# Patient Record
Sex: Female | Born: 1990 | Hispanic: No | Marital: Married | Smoking: Never smoker
Health system: Southern US, Community
[De-identification: ages and names within clinical notes are randomized; demographics above are authoritative.]

## PROBLEM LIST (undated history)

## (undated) ENCOUNTER — Inpatient Hospital Stay (HOSPITAL_COMMUNITY): Payer: Self-pay

## (undated) DIAGNOSIS — Z789 Other specified health status: Secondary | ICD-10-CM

## (undated) DIAGNOSIS — R51 Headache: Secondary | ICD-10-CM

## (undated) DIAGNOSIS — Z8669 Personal history of other diseases of the nervous system and sense organs: Secondary | ICD-10-CM

## (undated) DIAGNOSIS — O26613 Liver and biliary tract disorders in pregnancy, third trimester: Principal | ICD-10-CM

## (undated) DIAGNOSIS — K831 Obstruction of bile duct: Secondary | ICD-10-CM

## (undated) HISTORY — DX: Obstruction of bile duct: K83.1

## (undated) HISTORY — DX: Liver and biliary tract disorders in pregnancy, third trimester: O26.613

---
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
## 2010-06-17 NOTE — L&D Delivery Note (Signed)
Delivery Note At 3:59 PM a viable female was delivered via Vaginal, Spontaneous Delivery (Presentation: ; Occiput Anterior).  APGAR: 9, 9; weight 6 lb 6.5 oz (2906 g).   Placenta status: Intact, Spontaneous.  Cord: 3 vessels with the following complications: None.   Anesthesia: Epidural  Episiotomy: None Lacerations: 1st degree;Sulcus Suture Repair: 2.0 3.0 vicryl rapide Est. Blood Loss (mL): 200  Mom to postpartum.  Baby to nursery-stable.  Reed,Dana Dollinger A 06/02/2011, 4:26 PM

## 2010-10-01 LAB — URINALYSIS, ROUTINE W REFLEX MICROSCOPIC
Bilirubin Urine: NEGATIVE
Nitrite: NEGATIVE
Protein, ur: NEGATIVE mg/dL
Urobilinogen, UA: 0.2 mg/dL (ref 0.0–1.0)

## 2010-10-01 LAB — POCT PREGNANCY, URINE: Preg Test, Ur: NEGATIVE

## 2010-10-01 LAB — URINE MICROSCOPIC-ADD ON

## 2010-12-11 LAB — SYPHILIS: RPR W/REFLEX TO RPR TITER AND TREPONEMAL ANTIBODIES, TRADITIONAL SCREENING AND DIAGNOSIS ALGORITHM: RPR: NONREACTIVE

## 2010-12-11 LAB — GC/CHLAMYDIA PROBE AMP, GENITAL
Chlamydia: NEGATIVE
Gonorrhea: NEGATIVE

## 2010-12-11 LAB — HEPATITIS B SURFACE ANTIGEN: Hepatitis B Surface Ag: NEGATIVE

## 2010-12-11 LAB — ABO/RH: "RH Type ": POSITIVE

## 2010-12-11 LAB — ANTIBODY SCREEN: Antibody Screen: NEGATIVE

## 2010-12-22 ENCOUNTER — Inpatient Hospital Stay (HOSPITAL_COMMUNITY)
Admission: AD | Admit: 2010-12-22 | Discharge: 2010-12-23 | Disposition: A | Discharge status: Home / Self Care | Payer: Self-pay | Source: Ambulatory Visit | Attending: Obstetrics & Gynecology | Admitting: Obstetrics & Gynecology

## 2010-12-22 ENCOUNTER — Inpatient Hospital Stay (HOSPITAL_COMMUNITY): Payer: Self-pay | Admitting: Obstetrics & Gynecology

## 2010-12-22 ENCOUNTER — Inpatient Hospital Stay (HOSPITAL_COMMUNITY): Admission: AD | Admit: 2010-12-22 | Payer: Self-pay | Source: Ambulatory Visit | Admitting: Obstetrics & Gynecology

## 2010-12-22 DIAGNOSIS — N949 Unspecified condition associated with female genital organs and menstrual cycle: Secondary | ICD-10-CM

## 2010-12-22 DIAGNOSIS — M549 Dorsalgia, unspecified: Secondary | ICD-10-CM

## 2010-12-22 DIAGNOSIS — R109 Unspecified abdominal pain: Secondary | ICD-10-CM

## 2010-12-22 DIAGNOSIS — O99891 Other specified diseases and conditions complicating pregnancy: Secondary | ICD-10-CM | POA: Insufficient documentation

## 2010-12-22 DIAGNOSIS — O9989 Other specified diseases and conditions complicating pregnancy, childbirth and the puerperium: Secondary | ICD-10-CM

## 2010-12-22 HISTORY — DX: Personal history of other diseases of the nervous system and sense organs: Z86.69

## 2010-12-22 NOTE — Progress Notes (Signed)
Pt states, " I was awakened at 1 am this morning with tightening and cramping in my low abd and low back and it lasted for 2 hrs. It started back at 10 am, and it has hurt all day. It subsides and comes back."

## 2010-12-23 ENCOUNTER — Encounter (HOSPITAL_COMMUNITY): Payer: Self-pay | Admitting: Obstetrics and Gynecology

## 2010-12-23 LAB — URINALYSIS, ROUTINE W REFLEX MICROSCOPIC
Glucose, UA: NEGATIVE mg/dL
Leukocytes, UA: NEGATIVE
Protein, ur: NEGATIVE mg/dL
pH: 6 (ref 5.0–8.0)

## 2010-12-23 LAB — URINE MICROSCOPIC-ADD ON

## 2010-12-23 NOTE — Progress Notes (Signed)
"  Strong cramps in abd since last night.  The pain is constant and felt it all day.  The pain is felt around the belly button and in lower abd.  I feel something moving in my abd.  No bleeding or leaking of fluid from vagina."

## 2010-12-23 NOTE — ED Provider Notes (Signed)
History   Pt presents today c/o abd pain that comes and goes. She has also noticed some fetal movement. She denies vag dc or bleeding.  Chief Complaint  Patient presents with  . Abdominal Pain  . Back Pain   The history is provided by the patient. The history is limited by a language barrier. A language interpreter was used.    OB History    Grav Para Term Preterm Abortions TAB SAB Ect Mult Living   1         0      Past Medical History  Diagnosis Date  . Hx of migraines     duration = 1 year    Past Surgical History  Procedure Date  . Laparoscopic ovarian cystectomy 2007    No family history on file.  History  Substance Use Topics  . Smoking status: Never Smoker   . Smokeless tobacco: Not on file  . Alcohol Use: No     "I've only had a drink once ever."    Allergies: Allergies no known allergies  Prescriptions prior to admission  Medication Sig Dispense Refill  . diphenhydrAMINE (SOMINEX) 25 MG tablet Take 25 mg by mouth at bedtime as needed. For itching       . prenatal vitamin w/FE, FA (PRENATAL 1 + 1) 27-1 MG TABS Take 1 tablet by mouth daily.          Review of Systems  Constitutional: Negative for fever.  Cardiovascular: Negative for chest pain.  Genitourinary: Negative for dysuria, urgency and frequency.  Neurological: Negative for headaches.  Psychiatric/Behavioral: Negative for depression and suicidal ideas.   Physical Exam   Blood pressure 98/58, pulse 76, temperature 98.6 F (37 C), temperature source Oral, height 5\' 2"  (1.575 m), weight 134 lb 2 oz (60.839 kg), last menstrual period 08/12/2010.  Physical Exam  Constitutional: She appears well-developed and well-nourished.  GI: She exhibits no distension. There is no tenderness. There is no rebound and no guarding.  Genitourinary: No tenderness or bleeding around the vagina. No vaginal discharge found.       Cervix Lg/closed.    MAU Course  Procedures  A/P: 1) Round ligament pain:  discussed with pt at length with interpreter. Discussed diet, activity, risks, and precautions. She will f/u with Dr. Verdell Carmine office.    Henrietta Hoover, [place] 12/23/10 0126

## 2011-05-13 LAB — STREP B DNA PROBE: GBS: NEGATIVE

## 2011-06-02 ENCOUNTER — Inpatient Hospital Stay (HOSPITAL_COMMUNITY): Payer: Medicaid Other | Admitting: Anesthesiology

## 2011-06-02 ENCOUNTER — Encounter (HOSPITAL_COMMUNITY): Payer: Self-pay | Admitting: Anesthesiology

## 2011-06-02 ENCOUNTER — Inpatient Hospital Stay (HOSPITAL_COMMUNITY)
Admission: AD | Admit: 2011-06-02 | Discharge: 2011-06-04 | DRG: 775 | Disposition: A | Discharge status: Home / Self Care | Payer: Medicaid Other | Source: Ambulatory Visit | Attending: Obstetrics & Gynecology | Admitting: Obstetrics & Gynecology

## 2011-06-02 ENCOUNTER — Encounter (HOSPITAL_COMMUNITY): Payer: Self-pay | Admitting: *Deleted

## 2011-06-02 DIAGNOSIS — O4700 False labor before 37 completed weeks of gestation, unspecified trimester: Secondary | ICD-10-CM | POA: Diagnosis present

## 2011-06-02 DIAGNOSIS — IMO0002 Reserved for concepts with insufficient information to code with codable children: Secondary | ICD-10-CM | POA: Diagnosis present

## 2011-06-02 HISTORY — DX: Other specified health status: Z78.9

## 2011-06-02 LAB — CBC
Platelets: 226 10*3/uL (ref 150–400)
RBC: 4.32 MIL/uL (ref 3.87–5.11)
WBC: 9.4 10*3/uL (ref 4.0–10.5)

## 2011-06-02 LAB — RPR: RPR Ser Ql: NONREACTIVE

## 2011-06-02 LAB — ABO/RH: ABO/RH(D): O POS

## 2011-06-02 MED ORDER — OXYTOCIN 20 UNITS IN LACTATED RINGERS INFUSION - SIMPLE
125.0000 mL/h | Freq: Once | INTRAVENOUS | Status: AC
  Administered 2011-06-02: 125 mL/h via INTRAVENOUS
  Filled 2011-06-02: qty 1000

## 2011-06-02 MED ORDER — INFLUENZA VIRUS VACC SPLIT PF IM SUSP
0.5000 mL | INTRAMUSCULAR | Status: DC
  Filled 2011-06-02: qty 0.5

## 2011-06-02 MED ORDER — EPHEDRINE 5 MG/ML INJ: 10.0000 mg | INTRAVENOUS | Status: DC | PRN

## 2011-06-02 MED ORDER — MEDROXYPROGESTERONE ACETATE 150 MG/ML IM SUSP: 150.0000 mg | INTRAMUSCULAR | Status: DC | PRN

## 2011-06-02 MED ORDER — BENZOCAINE-MENTHOL 20-0.5 % EX AERO
INHALATION_SPRAY | CUTANEOUS | Status: AC
  Administered 2011-06-02
  Filled 2011-06-02: qty 56

## 2011-06-02 MED ORDER — FENTANYL 2.5 MCG/ML BUPIVACAINE 1/10 % EPIDURAL INFUSION (WH - ANES)
14.0000 mL/h | INTRAMUSCULAR | Status: DC
  Administered 2011-06-02 (×2): 14 mL/h via EPIDURAL
  Filled 2011-06-02 (×2): qty 60

## 2011-06-02 MED ORDER — FERROUS SULFATE 325 (65 FE) MG PO TABS
325.0000 mg | ORAL_TABLET | Freq: Two times a day (BID) | ORAL | Status: DC
  Administered 2011-06-03 – 2011-06-04 (×3): 325 mg via ORAL
  Filled 2011-06-02 (×4): qty 1

## 2011-06-02 MED ORDER — DIPHENHYDRAMINE HCL 50 MG/ML IJ SOLN: 12.5000 mg | INTRAMUSCULAR | Status: DC | PRN

## 2011-06-02 MED ORDER — PRENATAL PLUS 27-1 MG PO TABS
1.0000 | ORAL_TABLET | Freq: Every day | ORAL | Status: DC
  Administered 2011-06-03 – 2011-06-04 (×2): 1 via ORAL
  Filled 2011-06-02 (×2): qty 1

## 2011-06-02 MED ORDER — LACTATED RINGERS IV SOLN: 500.0000 mL | Freq: Once | INTRAVENOUS | Status: DC

## 2011-06-02 MED ORDER — LIDOCAINE HCL (PF) 1 % IJ SOLN
30.0000 mL | INTRAMUSCULAR | Status: DC | PRN
  Administered 2011-06-02: 30 mL via SUBCUTANEOUS
  Filled 2011-06-02: qty 30

## 2011-06-02 MED ORDER — TETANUS-DIPHTH-ACELL PERTUSSIS 5-2.5-18.5 LF-MCG/0.5 IM SUSP: 0.5000 mL | Freq: Once | INTRAMUSCULAR | Status: DC

## 2011-06-02 MED ORDER — IBUPROFEN 600 MG PO TABS
600.0000 mg | ORAL_TABLET | Freq: Four times a day (QID) | ORAL | Status: DC
  Administered 2011-06-02 – 2011-06-04 (×8): 600 mg via ORAL
  Filled 2011-06-02 (×9): qty 1

## 2011-06-02 MED ORDER — ACETAMINOPHEN 325 MG PO TABS: 650.0000 mg | ORAL_TABLET | ORAL | Status: DC | PRN

## 2011-06-02 MED ORDER — DIBUCAINE 1 % RE OINT: 1.0000 "application " | TOPICAL_OINTMENT | RECTAL | Status: DC | PRN

## 2011-06-02 MED ORDER — MEASLES, MUMPS & RUBELLA VAC ~~LOC~~ INJ: 0.5000 mL | INJECTION | Freq: Once | SUBCUTANEOUS | Status: DC

## 2011-06-02 MED ORDER — LANOLIN HYDROUS EX OINT: TOPICAL_OINTMENT | CUTANEOUS | Status: DC | PRN

## 2011-06-02 MED ORDER — ONDANSETRON HCL 4 MG/2ML IJ SOLN: 4.0000 mg | INTRAMUSCULAR | Status: DC | PRN

## 2011-06-02 MED ORDER — OXYTOCIN BOLUS FROM INFUSION
500.0000 mL | Freq: Once | INTRAVENOUS | Status: DC
  Filled 2011-06-02: qty 500

## 2011-06-02 MED ORDER — DIPHENHYDRAMINE HCL 25 MG PO CAPS: 25.0000 mg | ORAL_CAPSULE | Freq: Four times a day (QID) | ORAL | Status: DC | PRN

## 2011-06-02 MED ORDER — CITRIC ACID-SODIUM CITRATE 334-500 MG/5ML PO SOLN: 30.0000 mL | ORAL | Status: DC | PRN

## 2011-06-02 MED ORDER — MISOPROSTOL 50MCG HALF TABLET
75.0000 ug | ORAL_TABLET | ORAL | Status: DC
  Administered 2011-06-02: 75 ug via ORAL
  Filled 2011-06-02 (×2): qty 1
  Filled 2011-06-02: qty 0.75

## 2011-06-02 MED ORDER — FLEET ENEMA 7-19 GM/118ML RE ENEM: 1.0000 | ENEMA | RECTAL | Status: DC | PRN

## 2011-06-02 MED ORDER — MAGNESIUM HYDROXIDE 400 MG/5ML PO SUSP: 30.0000 mL | ORAL | Status: DC | PRN

## 2011-06-02 MED ORDER — PHENYLEPHRINE 40 MCG/ML (10ML) SYRINGE FOR IV PUSH (FOR BLOOD PRESSURE SUPPORT)
80.0000 ug | PREFILLED_SYRINGE | INTRAVENOUS | Status: DC | PRN
  Filled 2011-06-02 (×2): qty 5

## 2011-06-02 MED ORDER — BUTORPHANOL TARTRATE 2 MG/ML IJ SOLN
1.0000 mg | INTRAMUSCULAR | Status: DC | PRN
  Administered 2011-06-02: 1 mg via INTRAVENOUS
  Filled 2011-06-02: qty 1

## 2011-06-02 MED ORDER — ONDANSETRON HCL 4 MG/2ML IJ SOLN: 4.0000 mg | Freq: Four times a day (QID) | INTRAMUSCULAR | Status: DC | PRN

## 2011-06-02 MED ORDER — PHENYLEPHRINE 40 MCG/ML (10ML) SYRINGE FOR IV PUSH (FOR BLOOD PRESSURE SUPPORT)
80.0000 ug | PREFILLED_SYRINGE | INTRAVENOUS | Status: DC | PRN
  Filled 2011-06-02: qty 5

## 2011-06-02 MED ORDER — SENNOSIDES-DOCUSATE SODIUM 8.6-50 MG PO TABS
2.0000 | ORAL_TABLET | Freq: Every day | ORAL | Status: DC
  Administered 2011-06-02 – 2011-06-03 (×2): 2 via ORAL

## 2011-06-02 MED ORDER — TERBUTALINE SULFATE 1 MG/ML IJ SOLN: 0.2500 mg | Freq: Once | INTRAMUSCULAR | Status: DC | PRN

## 2011-06-02 MED ORDER — OXYCODONE-ACETAMINOPHEN 5-325 MG PO TABS: 2.0000 | ORAL_TABLET | ORAL | Status: DC | PRN

## 2011-06-02 MED ORDER — EPHEDRINE 5 MG/ML INJ
10.0000 mg | INTRAVENOUS | Status: DC | PRN
  Filled 2011-06-02: qty 4

## 2011-06-02 MED ORDER — EPHEDRINE 5 MG/ML INJ
10.0000 mg | INTRAVENOUS | Status: DC | PRN
  Filled 2011-06-02 (×2): qty 4

## 2011-06-02 MED ORDER — OXYCODONE-ACETAMINOPHEN 5-325 MG PO TABS
1.0000 | ORAL_TABLET | ORAL | Status: DC | PRN
  Administered 2011-06-03: 1 via ORAL
  Administered 2011-06-03: 2 via ORAL
  Administered 2011-06-04: 1 via ORAL
  Filled 2011-06-02: qty 2
  Filled 2011-06-02 (×2): qty 1

## 2011-06-02 MED ORDER — PHENYLEPHRINE 40 MCG/ML (10ML) SYRINGE FOR IV PUSH (FOR BLOOD PRESSURE SUPPORT): 80.0000 ug | PREFILLED_SYRINGE | INTRAVENOUS | Status: DC | PRN

## 2011-06-02 MED ORDER — INFLUENZA VIRUS VACC SPLIT PF IM SUSP
0.5000 mL | INTRAMUSCULAR | Status: AC
  Filled 2011-06-02: qty 0.5

## 2011-06-02 MED ORDER — WITCH HAZEL-GLYCERIN EX PADS: 1.0000 "application " | MEDICATED_PAD | CUTANEOUS | Status: DC | PRN

## 2011-06-02 MED ORDER — IBUPROFEN 600 MG PO TABS: 600.0000 mg | ORAL_TABLET | Freq: Four times a day (QID) | ORAL | Status: DC | PRN

## 2011-06-02 MED ORDER — FENTANYL 2.5 MCG/ML BUPIVACAINE 1/10 % EPIDURAL INFUSION (WH - ANES): 14.0000 mL/h | INTRAMUSCULAR | Status: DC

## 2011-06-02 MED ORDER — INFLUENZA VIRUS VACC SPLIT PF IM SUSP
0.5000 mL | INTRAMUSCULAR | Status: AC | PRN
  Administered 2011-06-04: 0.5 mL via INTRAMUSCULAR

## 2011-06-02 MED ORDER — BENZOCAINE-MENTHOL 20-0.5 % EX AERO
1.0000 "application " | INHALATION_SPRAY | CUTANEOUS | Status: DC | PRN
  Administered 2011-06-02: 1 via TOPICAL

## 2011-06-02 MED ORDER — ZOLPIDEM TARTRATE 10 MG PO TABS: 10.0000 mg | ORAL_TABLET | Freq: Every evening | ORAL | Status: DC | PRN

## 2011-06-02 MED ORDER — LACTATED RINGERS IV SOLN
INTRAVENOUS | Status: DC
  Administered 2011-06-02 (×2): via INTRAVENOUS

## 2011-06-02 MED ORDER — LACTATED RINGERS IV SOLN
500.0000 mL | INTRAVENOUS | Status: DC | PRN
  Administered 2011-06-02: 500 mL via INTRAVENOUS

## 2011-06-02 MED ORDER — LIDOCAINE HCL 1.5 % IJ SOLN
INTRAMUSCULAR | Status: DC | PRN
  Administered 2011-06-02 (×2): 5 mL via EPIDURAL

## 2011-06-02 MED ORDER — ZOLPIDEM TARTRATE 5 MG PO TABS: 5.0000 mg | ORAL_TABLET | Freq: Every evening | ORAL | Status: DC | PRN

## 2011-06-02 MED ORDER — ONDANSETRON HCL 4 MG PO TABS: 4.0000 mg | ORAL_TABLET | ORAL | Status: DC | PRN

## 2011-06-02 NOTE — Progress Notes (Signed)
  G1 at 38.3wks. Ctxs since 0120 this am. Denies leaking fld or bleeding. Good FM

## 2011-06-02 NOTE — Progress Notes (Signed)
Dr Tamela Oddi notified of patient, tracing, ctx pattern, sve result. Also aware that prenatal record have dates that will make patient 42weeks today. Dr Tamela Oddi states to change the edc to 06/13/11 which is what the patient states that she was given a couple months ago. Order received to admit to l/d unit

## 2011-06-02 NOTE — H&P (Signed)
Dana Reed is a 20 y.o. female presenting for contractions. Maternal Medical History:  Reason for admission: Reason for admission: contractions.  Reason for Admission:   nauseaContractions: Frequency: regular.    Fetal activity: Perceived fetal activity is normal.    Prenatal complications: no prenatal complications   OB History    Grav Para Term Preterm Abortions TAB SAB Ect Mult Living   1         0     Past Medical History  Diagnosis Date  . Hx of migraines     duration = 1 year  . No pertinent past medical history    Past Surgical History  Procedure Date  . Laparoscopic ovarian cystectomy 2007   Family History: family history is not on file. Social History:  reports that she has never smoked. She does not have any smokeless tobacco history on file. She reports that she does not drink alcohol or use illicit drugs.  Review of Systems  Constitutional: Negative for fever.  Eyes: Negative for blurred vision.  Respiratory: Negative for shortness of breath.   Gastrointestinal: Negative for nausea and vomiting.  Skin: Negative for rash.  Neurological: Negative for headaches.    Dilation: 3.5 Effacement (%): 80 Station: -1 Exam by:: Jackson-Moore (IUPC placed) Blood pressure 98/79, pulse 87, temperature 97.8 F (36.6 C), temperature source Axillary, resp. rate 18, height 5\' 3"  (1.6 m), weight 74.753 kg (164 lb 12.8 oz), last menstrual period 08/12/2010, SpO2 98.00%. Maternal Exam:  Uterine Assessment: Contraction strength is moderate.  Contraction frequency is regular.   Abdomen: Patient reports no abdominal tenderness. Fetal presentation: vertex  Introitus: not evaluated.     Fetal Exam Fetal Monitor Review: Variability: moderate (6-25 bpm).   Pattern: accelerations present and no decelerations.    Fetal State Assessment: Category I - tracings are normal.     Physical Exam  Constitutional: She appears well-developed.  HENT:  Head: Normocephalic.   Neck: Neck supple. No thyromegaly present.  Cardiovascular: Normal rate and regular rhythm.   Respiratory: Breath sounds normal.  GI: Soft. Bowel sounds are normal.  Skin: No rash noted.    Prenatal labs: ABO, Rh: O/Positive/-- (06/26 0000) Antibody: Negative (06/26 0000) Rubella: Immune (06/26 0000) RPR: Nonreactive (06/26 0000)  HBsAg: Negative (06/26 0000)  HIV: Non-reactive (06/26 0000)  GBS: Negative (11/26 0000)   Assessment/Plan: [place] at term, active labor, Category 1 FHT. Admit, anticipate an NSVD   JACKSON-MOORE,Jenifer Struve A 06/02/2011, 10:36 AM

## 2011-06-02 NOTE — Progress Notes (Signed)
In-house spanish interpreter helping 

## 2011-06-02 NOTE — Anesthesia Postprocedure Evaluation (Signed)
  Anesthesia Post-op Note  Patient: Dana Reed  Procedure(s) Performed: * No procedures listed *  Patient Location: PACU and Mother/Baby  Anesthesia Type: Epidural  Level of Consciousness: awake, alert  and oriented  Airway and Oxygen Therapy: Patient Spontanous Breathing   Post-op Assessment: Patient's Cardiovascular Status Stable and Respiratory Function Stable  Post-op Vital Signs: stable  Complications: No apparent anesthesia complications

## 2011-06-02 NOTE — Anesthesia Procedure Notes (Signed)
Epidural Patient location during procedure: OB Start time: 06/02/2011 10:28 AM  Staffing Anesthesiologist: Brayton Caves R Performed by: anesthesiologist   Preanesthetic Checklist Completed: patient identified, site marked, surgical consent, pre-op evaluation, timeout performed, IV checked, risks and benefits discussed and monitors and equipment checked  Epidural Patient position: sitting Prep: site prepped and draped and DuraPrep Patient monitoring: continuous pulse ox and blood pressure Approach: midline Injection technique: LOR air and LOR saline  Needle:  Needle type: Tuohy  Needle gauge: 17 G Needle length: 9 cm Needle insertion depth: 6 cm Catheter type: closed end flexible Catheter size: 19 Gauge Catheter at skin depth: 11 cm Test dose: negative  Assessment Events: blood not aspirated, injection not painful, no injection resistance, negative IV test and no paresthesia  Additional Notes Patient identified.  Risk benefits discussed including failed block, incomplete pain control, headache, nerve damage, paralysis, blood pressure changes, nausea, vomiting, reactions to medication both toxic or allergic, and postpartum back pain.  Patient expressed understanding and wished to proceed.  All questions were answered.  Sterile technique used throughout procedure and epidural site dressed with sterile barrier dressing. No paresthesia or other complications noted.The patient did not experience any signs of intravascular injection such as tinnitus or metallic taste in mouth nor signs of intrathecal spread such as rapid motor block. Please see nursing notes for vital signs.

## 2011-06-02 NOTE — Anesthesia Preprocedure Evaluation (Signed)

## 2011-06-03 LAB — CBC
HCT: 29.4 % — ABNORMAL LOW (ref 36.0–46.0)
Hemoglobin: 9.5 g/dL — ABNORMAL LOW (ref 12.0–15.0)
MCHC: 32.3 g/dL (ref 30.0–36.0)
RBC: 3.44 MIL/uL — ABNORMAL LOW (ref 3.87–5.11)
WBC: 12.9 10*3/uL — ABNORMAL HIGH (ref 4.0–10.5)

## 2011-06-03 NOTE — Progress Notes (Signed)
UR chart review completed.  

## 2011-06-03 NOTE — Progress Notes (Signed)
Patient complain of feeling dizzy and tired. Also that her feet are swollen at this time. Currently the patient vitals are the following 106/67 with a heart rate of 76. At this time patient  Return to bed and feet was elevate at this time. Patient refused motrin and Ferrous sulfate at this time. Rn will monitor for now.

## 2011-06-03 NOTE — Progress Notes (Signed)
Post Partum Day 1 Subjective: no complaints, up ad lib, tolerating PO and + flatus  Objective: Blood pressure 99/54, pulse 85, temperature 98.5 F (36.9 C), temperature source Oral, resp. rate 18, height 5\' 3"  (1.6 m), weight 74.753 kg (164 lb 12.8 oz), last menstrual period 08/12/2010, SpO2 100.00%, unknown if currently breastfeeding.  Physical Exam:  General: alert and no distress Lochia: appropriate Uterine Fundus: firm Incision: healing well DVT Evaluation: No evidence of DVT seen on physical exam.   Basename 06/03/11 0515 06/02/11 0515  HGB 9.5* 12.1  HCT 29.4* 36.8    Assessment/Plan: Plan for discharge tomorrow   LOS: 1 day   HARPER,CHARLES A 06/03/2011, 5:50 AM

## 2011-06-04 MED ORDER — FERROUS SULFATE 325 (65 FE) MG PO TABS: 325.0000 mg | ORAL_TABLET | Freq: Two times a day (BID) | ORAL | Status: DC

## 2011-06-04 MED ORDER — IBUPROFEN 600 MG PO TABS: 600.0000 mg | ORAL_TABLET | Freq: Four times a day (QID) | ORAL | Status: AC

## 2011-06-04 NOTE — Discharge Summary (Signed)
Obstetric Discharge Summary Reason for Admission: onset of labor Prenatal Procedures: none Intrapartum Procedures: spontaneous vaginal delivery Postpartum Procedures: none Complications-Operative and Postpartum: 1st degree perineal laceration Hemoglobin  Date Value Range Status  06/03/2011 9.5* 12.0-15.0 (g/dL) Final     DELTA CHECK NOTED     REPEATED TO VERIFY     HCT  Date Value Range Status  06/03/2011 29.4* 36.0-46.0 (%) Final    Discharge Diagnoses: Term Pregnancy-delivered  Discharge Information: Date: 06/04/2011 Activity: unrestricted and pelvic rest Diet: routine Medications: PNV, Ibuprofen and Iron Condition: stable Instructions: see written instructions, call office for appt and any questions. Discharge to: home Follow-up Information    Make an appointment with Roseanna Rainbow, MD.   Contact information:   [address], [address] [place] [address] [phone]          Newborn Data: Live born female  Birth Weight: 6 lb 6.5 oz (2906 g) APGAR: 9, 9  Home with mother.  [name], Dana Reed 06/04/2011, 8:41 AM

## 2011-06-04 NOTE — Progress Notes (Signed)
Post Partum Day 2 Subjective: up ad lib, voiding, tolerating PO, + flatus and desires DC home today. Sore nipples, breastfeeding.   Objective: Blood pressure 102/66, pulse 78, temperature 98.1 F (36.7 C), temperature source Oral, resp. rate 18, height 5\' 3"  (1.6 m), weight 74.753 kg (164 lb 12.8 oz), last menstrual period 08/12/2010, SpO2 100.00%, unknown if currently breastfeeding.  Physical Exam:  General: alert, cooperative, appears stated age and no distress Lochia: appropriate Uterine Fundus: firm Incision: healing well, no significant drainage, no significant erythema, (1st degree laceration) DVT Evaluation: No evidence of DVT seen on physical exam. Negative Homan's sign. No cords or calf tenderness.   Basename 06/03/11 0515 06/02/11 0515  HGB 9.5* 12.1  HCT 29.4* 36.8    Assessment/Plan: Discharge home, Breastfeeding, Lactation consult and Contraception undecided.    LOS: 2 days   [name], REBECCA CNM 06/04/2011, 8:33 AM

## 2011-07-14 ENCOUNTER — Encounter (HOSPITAL_COMMUNITY): Payer: Self-pay | Admitting: Emergency Medicine

## 2011-07-14 ENCOUNTER — Emergency Department (HOSPITAL_COMMUNITY)
Admission: EM | Admit: 2011-07-14 | Discharge: 2011-07-14 | Disposition: A | Discharge status: Home / Self Care | Payer: Self-pay | Attending: Emergency Medicine | Admitting: Emergency Medicine

## 2011-07-14 DIAGNOSIS — O9123 Nonpurulent mastitis associated with lactation: Secondary | ICD-10-CM

## 2011-07-14 DIAGNOSIS — R509 Fever, unspecified: Secondary | ICD-10-CM | POA: Insufficient documentation

## 2011-07-14 DIAGNOSIS — N644 Mastodynia: Secondary | ICD-10-CM | POA: Insufficient documentation

## 2011-07-14 DIAGNOSIS — O9122 Nonpurulent mastitis associated with the puerperium: Secondary | ICD-10-CM | POA: Insufficient documentation

## 2011-07-14 DIAGNOSIS — N63 Unspecified lump in unspecified breast: Secondary | ICD-10-CM | POA: Insufficient documentation

## 2011-07-14 MED ORDER — CEPHALEXIN 500 MG PO CAPS: 500.0000 mg | ORAL_CAPSULE | Freq: Four times a day (QID) | ORAL | Status: DC

## 2011-07-14 NOTE — ED Notes (Signed)
Recent delivery of child on 06/02/2011; pt. Is lactating; over the last 24 hrs. Pt. Having hot and cold spells. Bilateral breast pain when pt. Palpates her breast. Pt. Is mildly lactating bilaterally.

## 2011-07-14 NOTE — ED Provider Notes (Signed)
Medical screening examination/treatment/procedure(s) were conducted as a shared visit with non-physician practitioner(s) and myself.  I personally evaluated the patient during the encounter  Delivered child 1 months ago.  Presents with bilateral breast pain for the past day - described as burning.  Subjective fever and chills.  Mild swelling and tenderness and has no significant redness  Dc home with keflex and instructions to breast feed and or pump   Dayton Bailiff, MD 07/14/11 1712

## 2011-07-14 NOTE — ED Notes (Signed)
C/o pain to bilateral breast, fever, and chills since last night.  Pt is breastfeeding.  Pt had normal vaginal delivery 06/02/11.

## 2011-07-14 NOTE — ED Provider Notes (Signed)
History     CSN: [id number]  Arrival date & time 07/14/11  1536   First MD Initiated Contact with Patient 07/14/11 1607      Chief Complaint  Patient presents with  . Breast Pain    (Consider location/radiation/quality/duration/timing/severity/associated sxs/prior treatment) The history is provided by the patient. A language interpreter was used.  21 year old lactating Hispanic female presents to the ED with bilateral breast pain and redness. She delivered her first child about 1 month ago and has been breastfeeding without any difficulty up until yesterday. She first noticed the breast pain last night around 1800. She has not breastfed since then. She describes the breast pain as pressure, pulsating, and burning. She also complains of chills and fever. She tried Ibuprofen for temporary relief. She denies itching.    Past Medical History  Diagnosis Date  . Hx of migraines     duration = 1 year  . No pertinent past medical history     Past Surgical History  Procedure Date  . Laparoscopic ovarian cystectomy 2007    No family history on file.  History  Substance Use Topics  . Smoking status: Never Smoker   . Smokeless tobacco: Not on file  . Alcohol Use: No     "I've only had a drink once ever."    OB History    Grav Para Term Preterm Abortions TAB SAB Ect Mult Living   1 1 1       1       Review of Systems All pertinent positives and negatives reviewed in the history of present illness  Allergies  Review of patient's allergies indicates no known allergies.  Home Medications  No current outpatient prescriptions on file.  BP 108/47  Pulse 101  Temp(Src) 98.1 F (36.7 C) (Oral)  Resp 18  SpO2 99%  Breastfeeding? Yes  Physical Exam  Constitutional: She is oriented to person, place, and time. She appears well-developed and well-nourished. No distress.  HENT:  Head: Normocephalic and atraumatic.  Genitourinary: There is breast swelling and tenderness. No  breast discharge or bleeding.  Musculoskeletal: Normal range of motion.  Neurological: She is alert and oriented to person, place, and time.  Skin: Skin is warm and dry. She is not diaphoretic.  Psychiatric: She has a normal mood and affect. Her behavior is normal.    ED Course  Procedures (including critical care time)   Supportive treatment is given to the patient warm compresses  Breasts should be frequently emptied of milk through continued nursing or pumping  To facilitate milk letdown, nursing should be initiated with the uninfected breast        MDM  The patient has mastitis. Told to return here for any worsening in her condition. The patient has an appointment tomorrow.        Carlyle Dolly, PA-C 07/14/11 1708

## 2011-07-17 ENCOUNTER — Encounter (HOSPITAL_COMMUNITY): Payer: Self-pay | Admitting: Emergency Medicine

## 2011-07-17 ENCOUNTER — Emergency Department (HOSPITAL_COMMUNITY)
Admission: EM | Admit: 2011-07-17 | Discharge: 2011-07-17 | Disposition: A | Discharge status: Home / Self Care | Payer: Self-pay | Attending: Emergency Medicine | Admitting: Emergency Medicine

## 2011-07-17 DIAGNOSIS — N63 Unspecified lump in unspecified breast: Secondary | ICD-10-CM | POA: Insufficient documentation

## 2011-07-17 DIAGNOSIS — N61 Mastitis without abscess: Secondary | ICD-10-CM | POA: Insufficient documentation

## 2011-07-17 DIAGNOSIS — N644 Mastodynia: Secondary | ICD-10-CM | POA: Insufficient documentation

## 2011-07-17 MED ORDER — OXYCODONE-ACETAMINOPHEN 5-325 MG PO TABS: 1.0000 | ORAL_TABLET | Freq: Four times a day (QID) | ORAL | Status: AC | PRN

## 2011-07-17 MED ORDER — SULFAMETHOXAZOLE-TRIMETHOPRIM 800-160 MG PO TABS: 1.0000 | ORAL_TABLET | Freq: Two times a day (BID) | ORAL | Status: AC

## 2011-07-17 MED ORDER — SULFAMETHOXAZOLE-TMP DS 800-160 MG PO TABS
1.0000 | ORAL_TABLET | Freq: Once | ORAL | Status: AC
  Administered 2011-07-17: 1 via ORAL
  Filled 2011-07-17: qty 1

## 2011-07-17 NOTE — ED Provider Notes (Signed)
Medical screening examination/treatment/procedure(s) were performed by non-physician practitioner and as supervising physician I was immediately available for consultation/collaboration.  Juliet Rude. Rubin Payor, MD 07/17/11 7829

## 2011-07-17 NOTE — ED Provider Notes (Signed)
History     CSN: [id number]  Arrival date & time 07/17/11  1919   First MD Initiated Contact with Patient 07/17/11 1956      Chief Complaint  Patient presents with  . Breast Pain    (Consider location/radiation/quality/duration/timing/severity/associated sxs/prior treatment) HPI Comments: Patient seen 2 days ago for sore breasts started on Keflex R breast improved but L more sore, tender and has developed red areas   The history is provided by the patient.    Past Medical History  Diagnosis Date  . Hx of migraines     duration = 1 year  . No pertinent past medical history     Past Surgical History  Procedure Date  . Laparoscopic ovarian cystectomy 2007    No family history on file.  History  Substance Use Topics  . Smoking status: Never Smoker   . Smokeless tobacco: Not on file  . Alcohol Use: No     "I've only had a drink once ever."    OB History    Grav Para Term Preterm Abortions TAB SAB Ect Mult Living   1 1 1       1       Review of Systems  Constitutional: Negative for fever and chills.  Genitourinary: Negative for dysuria.  Neurological: Negative for dizziness and weakness.    Allergies  Review of patient's allergies indicates no known allergies.  Home Medications   Current Outpatient Rx  Name Route Sig Dispense Refill  . ACETAMINOPHEN 325 MG PO TABS Oral Take 650 mg by mouth every 6 (six) hours as needed. For pain    . CEPHALEXIN 500 MG PO CAPS Oral Take 500 mg by mouth 4 (four) times daily. Take until 07/24/11    . OXYCODONE-ACETAMINOPHEN 5-325 MG PO TABS Oral Take 1 tablet by mouth every 6 (six) hours as needed for pain. 10 tablet 0  . SULFAMETHOXAZOLE-TRIMETHOPRIM 800-160 MG PO TABS Oral Take 1 tablet by mouth 2 (two) times daily. 14 tablet 0    BP 103/75  Pulse 98  Temp 98 F (36.7 C)  Resp 18  SpO2 98%  Physical Exam  Constitutional: She is oriented to person, place, and time. She appears well-developed and well-nourished.  HENT:    Head: Normocephalic.  Eyes: Pupils are equal, round, and reactive to light.  Neck: Normal range of motion.  Pulmonary/Chest: Effort normal.  Abdominal: Soft.  Musculoskeletal: Normal range of motion.  Neurological: She is alert and oriented to person, place, and time.  Skin: No rash noted. No pallor.       Red firm mass L breast at 2 o clock area   Psychiatric: She has a normal mood and affect.    ED Course  Procedures (including critical care time)  Labs Reviewed - No data to display No results found.   1. Mastitis in female    After warm compresses applied.  Breast was able to be milked.  The red, firm areas are softer.  She is producing a good milk.  She had many questions about continuing nursing versus stopping.  We'll continue with the antibiotics.  She was informed of symptoms to return to [facility], including fever, chills, worsening of the reddened area, more pain in the breast   MDM  mastitis        Arman Filter, NP 07/17/11 2032  Arman Filter, NP 07/17/11 2123

## 2011-07-17 NOTE — ED Notes (Signed)
PT. REPORTS BREAST SORENESS ONSET YESTERDAY , DENIES INJURY , STATES BREAST FEEDING FOR 1 MONTH . NO DRAINAGE, DENIES FEVER OR CHILLS.

## 2011-07-17 NOTE — ED Notes (Signed)
Pt does not speak English family at bedside as a interpreter. Pt c/o bilateral breast swelling, hot to touch, and a hard feeling. Pt has been breast feeding x1 mon., pt seen here Sunday for the same, pt prescribed Cephalexin 500 mg QID w/no relief, pt also instructed to pump additional milk w/breast pump, pt reports no change in condition. Pt denies any drainage other than milk, but reports her L breast has stopped producing milk x1 day.

## 2012-09-20 ENCOUNTER — Emergency Department (HOSPITAL_COMMUNITY)
Admission: EM | Admit: 2012-09-20 | Discharge: 2012-09-20 | Disposition: A | Discharge status: Home / Self Care | Payer: Self-pay | Attending: Emergency Medicine | Admitting: Emergency Medicine

## 2012-09-20 ENCOUNTER — Encounter (HOSPITAL_COMMUNITY): Payer: Self-pay

## 2012-09-20 DIAGNOSIS — R51 Headache: Secondary | ICD-10-CM | POA: Insufficient documentation

## 2012-09-20 DIAGNOSIS — R42 Dizziness and giddiness: Secondary | ICD-10-CM | POA: Insufficient documentation

## 2012-09-20 MED ORDER — DEXAMETHASONE SODIUM PHOSPHATE 4 MG/ML IJ SOLN
10.0000 mg | Freq: Once | INTRAMUSCULAR | Status: AC
  Administered 2012-09-20: 10 mg via INTRAVENOUS
  Filled 2012-09-20: qty 3

## 2012-09-20 MED ORDER — SODIUM CHLORIDE 0.9 % IV BOLUS (SEPSIS)
1000.0000 mL | Freq: Once | INTRAVENOUS | Status: AC
  Administered 2012-09-20: 1000 mL via INTRAVENOUS

## 2012-09-20 MED ORDER — IBUPROFEN 800 MG PO TABS: 800.0000 mg | ORAL_TABLET | Freq: Three times a day (TID) | ORAL | Status: DC

## 2012-09-20 MED ORDER — DIPHENHYDRAMINE HCL 50 MG/ML IJ SOLN
25.0000 mg | Freq: Once | INTRAMUSCULAR | Status: AC
  Administered 2012-09-20: 25 mg via INTRAVENOUS
  Filled 2012-09-20: qty 1

## 2012-09-20 MED ORDER — METOCLOPRAMIDE HCL 5 MG/ML IJ SOLN
10.0000 mg | Freq: Once | INTRAMUSCULAR | Status: AC
  Administered 2012-09-20: 10 mg via INTRAVENOUS
  Filled 2012-09-20: qty 2

## 2012-09-20 NOTE — ED Notes (Addendum)
Pt to ed with multiple c/o.  States that she started having headache, numbness in hand, nauseated, rapid heart beat around 10 pm last night. Hx of migraines and states this ha is similar. diag with uti last week almost finished with meds. ( this was done with the interpreter line)

## 2012-09-20 NOTE — ED Notes (Signed)
Pt dc to home.   Pt states understanding to dc instructions.  Pt ambulatory to exit without difficulty.  Pt denies need for w/c. 

## 2012-09-20 NOTE — ED Notes (Signed)
Pt ambulatory to br

## 2012-09-20 NOTE — ED Provider Notes (Signed)
History     CSN: [id number]  Arrival date & time 09/20/12  0327   None     Chief Complaint  Patient presents with  . Dizziness  . Headache    (Consider location/radiation/quality/duration/timing/severity/associated sxs/prior treatment) HPI History provided by patient through interpreter, a Spanish-speaking primarily. Headache with palpitations and hand tingling onset tonight when going to bed. Headache is all lower, throbbing in quality and she has associated nausea.  No photophobia. No trauma. No rash. No recent illness. No fevers. No vomiting. Pain 9 out of 10 in severity, gradual in onset. His history of migraine headaches and this headache feels similar. No weakness or unilateral symptoms. No known alleviating factors. Past Medical History  Diagnosis Date  . Hx of migraines     duration = 1 year  . No pertinent past medical history     Past Surgical History  Procedure Laterality Date  . Laparoscopic ovarian cystectomy  2007    No family history on file.  History  Substance Use Topics  . Smoking status: Never Smoker   . Smokeless tobacco: Not on file  . Alcohol Use: No     Comment: "I've only had a drink once ever."    OB History   Grav Para Term Preterm Abortions TAB SAB Ect Mult Living   1 1 1       1       Review of Systems  Constitutional: Negative for fever and chills.  HENT: Negative for neck pain and neck stiffness.   Eyes: Negative for pain.  Respiratory: Negative for shortness of breath.   Cardiovascular: Negative for chest pain.  Gastrointestinal: Negative for abdominal pain.  Genitourinary: Negative for dysuria.  Musculoskeletal: Negative for back pain.  Skin: Negative for rash.  Neurological: Positive for headaches. Negative for seizures and syncope.  All other systems reviewed and are negative.    Allergies  Review of patient's allergies indicates no known allergies.  Home Medications   Current Outpatient Rx  Name  Route  Sig  Dispense   Refill  . acetaminophen (TYLENOL) 325 MG tablet   Oral   Take 650 mg by mouth every 6 (six) hours as needed. For pain           There were no vitals taken for this visit.  Physical Exam  Constitutional: She is oriented to person, place, and time. She appears well-developed and well-nourished.  HENT:  Head: Normocephalic and atraumatic.  Eyes: Conjunctivae and EOM are normal. Pupils are equal, round, and reactive to light.  Neck: Full passive range of motion without pain. Neck supple. No thyromegaly present.  No meningismus  Cardiovascular: Normal rate, regular rhythm, S1 normal, S2 normal and intact distal pulses.   Pulmonary/Chest: Effort normal and breath sounds normal.  Abdominal: Soft. Bowel sounds are normal. There is no tenderness. There is no CVA tenderness.  Musculoskeletal: Normal range of motion.  Neurological: She is alert and oriented to person, place, and time. She has normal strength and normal reflexes. No cranial nerve deficit or sensory deficit. She displays a negative Romberg sign. GCS eye subscore is 4. GCS verbal subscore is 5. GCS motor subscore is 6.  Normal Gait  Skin: Skin is warm and dry. No rash noted. No cyanosis. Nails show no clubbing.  Psychiatric: She has a normal mood and affect. Her speech is normal and behavior is normal.    ED Course  Procedures (including critical care time)   IV fluids. Headache cocktail  5:32 AM  on recheck is feeling much better, headache and symptoms resolved. Patient feels comfortable plan discharge home. Repeat neurologic exam normal and unchanged. Prescription for Motrin provided, referral to outpatient clinic. Return precautions provided.  MDM   Headache and symptoms of migraine with history of same. Improved with IV fluids and medications.    Serial exams  Condition improved  Vital signs and nursing notes reviewed and considered        Sunnie Nielsen, MD 09/20/12 [phone]

## 2013-01-01 ENCOUNTER — Emergency Department (HOSPITAL_COMMUNITY)
Admission: EM | Admit: 2013-01-01 | Discharge: 2013-01-01 | Disposition: A | Discharge status: Home / Self Care | Payer: Self-pay | Attending: Emergency Medicine | Admitting: Emergency Medicine

## 2013-01-01 ENCOUNTER — Encounter (HOSPITAL_COMMUNITY): Payer: Self-pay | Admitting: Family Medicine

## 2013-01-01 DIAGNOSIS — M25511 Pain in right shoulder: Secondary | ICD-10-CM

## 2013-01-01 DIAGNOSIS — J029 Acute pharyngitis, unspecified: Secondary | ICD-10-CM | POA: Insufficient documentation

## 2013-01-01 DIAGNOSIS — R1310 Dysphagia, unspecified: Secondary | ICD-10-CM | POA: Insufficient documentation

## 2013-01-01 DIAGNOSIS — R110 Nausea: Secondary | ICD-10-CM | POA: Insufficient documentation

## 2013-01-01 DIAGNOSIS — Z8679 Personal history of other diseases of the circulatory system: Secondary | ICD-10-CM | POA: Insufficient documentation

## 2013-01-01 DIAGNOSIS — Y9389 Activity, other specified: Secondary | ICD-10-CM | POA: Insufficient documentation

## 2013-01-01 DIAGNOSIS — T148XXA Other injury of unspecified body region, initial encounter: Secondary | ICD-10-CM

## 2013-01-01 DIAGNOSIS — X503XXA Overexertion from repetitive movements, initial encounter: Secondary | ICD-10-CM | POA: Insufficient documentation

## 2013-01-01 DIAGNOSIS — IMO0002 Reserved for concepts with insufficient information to code with codable children: Secondary | ICD-10-CM | POA: Insufficient documentation

## 2013-01-01 DIAGNOSIS — Y929 Unspecified place or not applicable: Secondary | ICD-10-CM | POA: Insufficient documentation

## 2013-01-01 DIAGNOSIS — H9209 Otalgia, unspecified ear: Secondary | ICD-10-CM | POA: Insufficient documentation

## 2013-01-01 DIAGNOSIS — B9789 Other viral agents as the cause of diseases classified elsewhere: Secondary | ICD-10-CM

## 2013-01-01 LAB — RAPID STREP SCREEN (MED CTR MEBANE ONLY): Streptococcus, Group A Screen (Direct): NEGATIVE

## 2013-01-01 MED ORDER — ACETAMINOPHEN 325 MG PO TABS
650.0000 mg | ORAL_TABLET | Freq: Once | ORAL | Status: AC
  Administered 2013-01-01: 650 mg via ORAL
  Filled 2013-01-01: qty 2

## 2013-01-01 NOTE — ED Notes (Signed)
Per pt family pt has been having sore throat, ear pain and arm pain for a few days.

## 2013-01-01 NOTE — ED Provider Notes (Signed)
History    CSN: [id number] Arrival date & time 01/01/13  1136  First MD Initiated Contact with Patient 01/01/13 1230     Chief Complaint  Patient presents with  . Sore Throat  . Otalgia   (Consider location/radiation/quality/duration/timing/severity/associated sxs/prior Treatment) HPI Pt is a 21yo female hispanic speaking, used friend to translate. Sore throat x3 days, 9/10, worse with swallowing but able to eat and drink. Associated with bilateral ear pain, L>R.  Has also felt nauseated but denies vomiting or diarrhea. Denies chest pain, SOB, or difficulty breathing. Pt also c/o right shoulder pain x1 day, worse with movement, aching. Took tylenol with moderate relief.  Denies fever.  Denies sick contacts or recent travel.    Past Medical History  Diagnosis Date  . Hx of migraines     duration = 1 year  . No pertinent past medical history    Past Surgical History  Procedure Laterality Date  . Laparoscopic ovarian cystectomy  2007   History reviewed. No pertinent family history. History  Substance Use Topics  . Smoking status: Never Smoker   . Smokeless tobacco: Not on file  . Alcohol Use: No     Comment: "I've only had a drink once ever."   OB History   Grav Para Term Preterm Abortions TAB SAB Ect Mult Living   1 1 1       1      Review of Systems  Constitutional: Negative for fever and chills.  HENT: Positive for ear pain, sore throat and trouble swallowing ( pain). Negative for drooling, mouth sores and voice change.   Respiratory: Negative for cough and shortness of breath.   Cardiovascular: Negative for chest pain.  Gastrointestinal: Positive for nausea. Negative for vomiting, abdominal pain and diarrhea.  All other systems reviewed and are negative.    Allergies  Review of patient's allergies indicates no known allergies.  Home Medications   Current Outpatient Rx  Name  Route  Sig  Dispense  Refill  . Acetaminophen (TYLENOL PO)   Oral   Take 1 tablet  by mouth every 4 (four) hours as needed (for pain).          BP 117/83  Pulse 83  Temp(Src) 98.3 F (36.8 C) (Oral)  Resp 16  SpO2 99%  LMP 12/09/2012 Physical Exam  Nursing note and vitals reviewed. Constitutional: She appears well-developed and well-nourished.  Pt sitting comfortably in exam bed, NAD.     HENT:  Head: Normocephalic and atraumatic.  Right Ear: Hearing, tympanic membrane, external ear and ear canal normal.  Left Ear: Hearing, tympanic membrane, external ear and ear canal normal.  Nose: Nose normal.  Mouth/Throat: Uvula is midline and mucous membranes are normal. Posterior oropharyngeal edema and posterior oropharyngeal erythema present. No oropharyngeal exudate or tonsillar abscesses.  Eyes: Conjunctivae are normal. No scleral icterus.  Neck: Normal range of motion. Neck supple.  Cardiovascular: Normal rate, regular rhythm and normal heart sounds.   Pulmonary/Chest: Effort normal and breath sounds normal. No respiratory distress. She has no wheezes. She has no rales. She exhibits no tenderness.  Abdominal: Soft. Bowel sounds are normal. She exhibits no distension and no mass. There is no tenderness. There is no rebound and no guarding.  Musculoskeletal: Normal range of motion. She exhibits tenderness ( anterior right shoulder, right upper trapezius ). She exhibits no edema.  Muscular tenderness over anterior and posterior right shoulder. FROM, no swelling, bruising, or redness.  Lymphadenopathy:    She has no  cervical adenopathy.  Neurological: She is alert.  Skin: Skin is warm and dry.    ED Course  Procedures (including critical care time) Labs Reviewed  RAPID STREP SCREEN  CULTURE, GROUP A STREP   No results found. 1. Sore throat (viral)   2. Right shoulder pain   3. Muscle strain     MDM  Likely viral pharyngitis.  Will get rapid strep.  Denies cough, lungs CTAB.  Do not need CXR at this time. Right shoulder pain likely muscle pain.  Denies fall  or other recent trauma, do not believe imaging needed at this time.   Tx: acetaminophen   Rapid strep: neg  Provided pt info packet on viral pharyngitis, suggest pt take acetaminophen for pain as well as use salt water gargles. May use ice and heat as needed for shoulder.  Provided work note.  Will discharge pt home and have her f/u with [facility] info provided. Return precautions given. Pt verbalized understanding and agreement with tx plan. Vitals: unremarkable. Discharged in stable condition.       Junius Finner, PA-C 01/01/13 1414

## 2013-01-02 NOTE — ED Provider Notes (Signed)
Medical screening examination/treatment/procedure(s) were performed by non-physician practitioner and as supervising physician I was immediately available for consultation/collaboration.   Jahlil Ziller III, MD 01/02/13 1021 

## 2013-01-03 LAB — CULTURE, GROUP A STREP

## 2013-02-25 LAB — OB RESULTS CONSOLE HEPATITIS B SURFACE ANTIGEN: Hepatitis B Surface Ag: NEGATIVE

## 2013-02-25 LAB — OB RESULTS CONSOLE GC/CHLAMYDIA
CHLAMYDIA, DNA PROBE: NEGATIVE
CHLAMYDIA, DNA PROBE: NEGATIVE
CHLAMYDIA, DNA PROBE: NEGATIVE
GC PROBE AMP, GENITAL: NEGATIVE
Gonorrhea: NEGATIVE
Gonorrhea: NEGATIVE

## 2013-02-25 LAB — OB RESULTS CONSOLE HIV ANTIBODY (ROUTINE TESTING)
HIV: NONREACTIVE
HIV: NONREACTIVE
HIV: NONREACTIVE

## 2013-02-25 LAB — OB RESULTS CONSOLE RPR
RPR: NONREACTIVE
RPR: NONREACTIVE

## 2013-02-25 LAB — OB RESULTS CONSOLE ANTIBODY SCREEN: ANTIBODY SCREEN: NEGATIVE

## 2013-02-25 LAB — OB RESULTS CONSOLE ABO/RH: RH Type: POSITIVE

## 2013-02-25 LAB — OB RESULTS CONSOLE VARICELLA ZOSTER ANTIBODY, IGG: VARICELLA IGG: NON-IMMUNE/NOT IMMUNE

## 2013-02-25 LAB — OB RESULTS CONSOLE PLATELET COUNT: PLATELETS: 209 10*3/uL

## 2013-02-25 LAB — OB RESULTS CONSOLE HGB/HCT, BLOOD
HEMATOCRIT: 34 %
HEMATOCRIT: 39 %
HEMOGLOBIN: 11.4 g/dL
HEMOGLOBIN: 12.9 g/dL

## 2013-02-25 LAB — CYTOLOGY - PAP: CYTOLOGY - PAP: NEGATIVE

## 2013-02-25 LAB — SICKLE CELL SCREEN: SICKLE CELL SCREEN: NORMAL

## 2013-02-25 LAB — URINE CULTURE: Urine Culture Result: NEGATIVE

## 2013-02-25 LAB — OB RESULTS CONSOLE RUBELLA ANTIBODY, IGM: Rubella: IMMUNE

## 2013-04-09 ENCOUNTER — Inpatient Hospital Stay (HOSPITAL_COMMUNITY)
Admission: AD | Admit: 2013-04-09 | Discharge: 2013-04-09 | Disposition: A | Discharge status: Home / Self Care | Payer: Self-pay | Source: Ambulatory Visit | Attending: Obstetrics & Gynecology | Admitting: Obstetrics & Gynecology

## 2013-04-09 ENCOUNTER — Encounter (HOSPITAL_COMMUNITY): Payer: Self-pay | Admitting: *Deleted

## 2013-04-09 DIAGNOSIS — O99891 Other specified diseases and conditions complicating pregnancy: Secondary | ICD-10-CM | POA: Insufficient documentation

## 2013-04-09 DIAGNOSIS — M549 Dorsalgia, unspecified: Secondary | ICD-10-CM | POA: Insufficient documentation

## 2013-04-09 DIAGNOSIS — O9989 Other specified diseases and conditions complicating pregnancy, childbirth and the puerperium: Secondary | ICD-10-CM

## 2013-04-09 LAB — WET PREP, GENITAL
Clue Cells Wet Prep HPF POC: NONE SEEN
Trich, Wet Prep: NONE SEEN
Yeast Wet Prep HPF POC: NONE SEEN

## 2013-04-09 LAB — URINALYSIS, ROUTINE W REFLEX MICROSCOPIC
Bilirubin Urine: NEGATIVE
Protein, ur: NEGATIVE mg/dL
Urobilinogen, UA: 0.2 mg/dL (ref 0.0–1.0)

## 2013-04-09 MED ORDER — CYCLOBENZAPRINE HCL 10 MG PO TABS
10.0000 mg | ORAL_TABLET | Freq: Once | ORAL | Status: AC
  Administered 2013-04-09: 10 mg via ORAL
  Filled 2013-04-09: qty 1

## 2013-04-09 NOTE — MAU Note (Signed)
Report called to Thressa Sheller CNM in short stay. Will be seen in short stay.

## 2013-04-09 NOTE — MAU Provider Note (Signed)
History     CSN: [id number]  Arrival date and time: 04/09/13 1941   None     Chief Complaint  Patient presents with  . Flank Pain   Flank Pain    Kemia Tellez-Gayosso is a 22 y.o. G2P1001 at 17 weeks who presents today with back pain X 3 days. She denies any VB, LOF of cramping. She denies fever, nausea or urinary sx. She states that she feels the baby move, but only in her back. She is concerned that this is abnormal.   Past Medical History  Diagnosis Date  . Hx of migraines     duration = 1 year  . No pertinent past medical history     Past Surgical History  Procedure Laterality Date  . Laparoscopic ovarian cystectomy  2007    No family history on file.  History  Substance Use Topics  . Smoking status: Never Smoker   . Smokeless tobacco: Not on file  . Alcohol Use: No     Comment: "I've only had a drink once ever."    Allergies: No Known Allergies  Prescriptions prior to admission  Medication Sig Dispense Refill  . acetaminophen (TYLENOL) 325 MG tablet Take 650 mg by mouth every 6 (six) hours as needed for pain.      . Prenatal Vit-Min-FA-Fish Oil ([employer] PRENATAL GUMMY PO) Take 2 tablets by mouth daily.        Review of Systems  Genitourinary: Positive for flank pain.   Physical Exam   Blood pressure 106/63, pulse 90, temperature 98.1 F (36.7 C), resp. rate 20, height 5\' 3"  (1.6 m), weight 67.858 kg (149 lb 9.6 oz), last menstrual period 12/09/2012.  Physical Exam  Nursing note and vitals reviewed. Constitutional: She is oriented to person, place, and time. She appears well-developed and well-nourished. No distress.  Cardiovascular: Normal rate.   Respiratory: Effort normal.  GI: Soft. There is no tenderness.  Genitourinary:  No CVA tenderness External: no lesion Vagina: small amount of white discharge Cervix: pink, smooth, no CMT, closed  Uterus: AGA    Neurological: She is alert and oriented to person, place, and time.  Skin: Skin is warm  and dry.  Psychiatric: She has a normal mood and affect.    MAU Course  Procedures  Results for orders placed during the hospital encounter of 04/09/13 (from the past 24 hour(s))  URINALYSIS, ROUTINE W REFLEX MICROSCOPIC     Status: Abnormal   Collection Time    04/09/13  8:00 PM      Result Value Range   Color, Urine YELLOW  YELLOW   APPearance CLEAR  CLEAR   Specific Gravity, Urine 1.025  1.005 - 1.030   pH 6.0  5.0 - 8.0   Glucose, UA NEGATIVE  NEGATIVE mg/dL   Hgb urine dipstick TRACE (*) NEGATIVE   Bilirubin Urine NEGATIVE  NEGATIVE   Ketones, ur NEGATIVE  NEGATIVE mg/dL   Protein, ur NEGATIVE  NEGATIVE mg/dL   Urobilinogen, UA 0.2  0.0 - 1.0 mg/dL   Nitrite NEGATIVE  NEGATIVE   Leukocytes, UA NEGATIVE  NEGATIVE  URINE MICROSCOPIC-ADD ON     Status: Abnormal   Collection Time    04/09/13  8:00 PM      Result Value Range   Squamous Epithelial / LPF FEW (*) RARE   WBC, UA 0-2  <3 WBC/hpf   RBC / HPF 0-2  <3 RBC/hpf   Bacteria, UA FEW (*) RARE  WET PREP, GENITAL  Status: Abnormal   Collection Time    04/09/13  8:40 PM      Result Value Range   Yeast Wet Prep HPF POC NONE SEEN  NONE SEEN   Trich, Wet Prep NONE SEEN  NONE SEEN   Clue Cells Wet Prep HPF POC NONE SEEN  NONE SEEN   WBC, Wet Prep HPF POC FEW (*) NONE SEEN    2127: Patient reports that back pain has improved after Flexeril.   Assessment and Plan   1. Back pain complicating pregnancy in second trimester    Comfort measures reviewed 2nd trimester precautions given FU as planned with the HD Return to MAU as needed  Tawnya Crook 04/09/2013, 8:27 PM

## 2013-04-09 NOTE — MAU Note (Signed)
Pt reports she has been having lower abd and back pain x 3 days.  Denies vag bleeding or discharge

## 2013-04-09 NOTE — MAU Note (Signed)
Having pain in R side of back. Feeling baby move in back and not in stomach

## 2013-06-17 NOTE — L&D Delivery Note (Signed)
I have seen and examined this patient and I agree with the above. Mill CreekSHAW, KIMBERLY 9:08 AM 09/02/2013

## 2013-06-17 NOTE — L&D Delivery Note (Signed)
Delivery Note At 6:43 AM a healthy female was delivered via Vaginal, Spontaneous Delivery (Presentation: ;  ).  APGAR: 9, 9; weight 6 lb 12.1 oz (3065 g).   Placenta status: Intact, Spontaneous.  Cord: 3 vessels with the following complications: None.    Anesthesia: Epidural  Episiotomy: None Lacerations: None Suture Repair: na Est. Blood Loss (mL): 250  Pt pushed with good maternal effort to deliver a healthy boy. Baby delivered without difficulty, cleaned and place on maternal abdomen. Cord clamping performed and cut by FOB. Placenta delivered intact with 3V cord. Vagina and perineum inspected and without laceration.  Mom to postpartum and baby to skin to skin.   Mom to postpartum.  Baby to Couplet care / Skin to Skin.  Wenda LowJoyner, Abriel Geesey 08/26/2013, 8:54 AM

## 2013-06-28 LAB — SYPHILIS ANTIBODY, IGM, ELISA: Syphilis Antibody, IgM, ELISA: NEGATIVE

## 2013-06-28 LAB — GLUCOSE TOLERANCE, 1 HOUR: GLUCOSE 1 HOUR GTT: 88

## 2013-07-15 DIAGNOSIS — Z789 Other specified health status: Secondary | ICD-10-CM | POA: Insufficient documentation

## 2013-07-15 DIAGNOSIS — Z2839 Other underimmunization status: Secondary | ICD-10-CM

## 2013-07-15 DIAGNOSIS — O09899 Supervision of other high risk pregnancies, unspecified trimester: Secondary | ICD-10-CM

## 2013-07-15 DIAGNOSIS — G43909 Migraine, unspecified, not intractable, without status migrainosus: Secondary | ICD-10-CM

## 2013-07-15 DIAGNOSIS — N390 Urinary tract infection, site not specified: Secondary | ICD-10-CM

## 2013-07-15 DIAGNOSIS — Z283 Underimmunization status: Secondary | ICD-10-CM

## 2013-07-15 DIAGNOSIS — Z603 Acculturation difficulty: Secondary | ICD-10-CM

## 2013-07-15 DIAGNOSIS — Z758 Other problems related to medical facilities and other health care: Secondary | ICD-10-CM | POA: Insufficient documentation

## 2013-07-19 ENCOUNTER — Encounter: Payer: Self-pay | Admitting: *Deleted

## 2013-07-22 ENCOUNTER — Encounter: Payer: Self-pay | Admitting: Obstetrics & Gynecology

## 2013-07-22 ENCOUNTER — Ambulatory Visit (INDEPENDENT_AMBULATORY_CARE_PROVIDER_SITE_OTHER): Payer: Self-pay | Admitting: Obstetrics & Gynecology

## 2013-07-22 VITALS — BP 103/67 | Temp 97.3°F | Wt 168.7 lb

## 2013-07-22 DIAGNOSIS — O0993 Supervision of high risk pregnancy, unspecified, third trimester: Secondary | ICD-10-CM | POA: Insufficient documentation

## 2013-07-22 DIAGNOSIS — O26613 Liver and biliary tract disorders in pregnancy, third trimester: Secondary | ICD-10-CM

## 2013-07-22 DIAGNOSIS — O2340 Unspecified infection of urinary tract in pregnancy, unspecified trimester: Secondary | ICD-10-CM

## 2013-07-22 DIAGNOSIS — O26643 Intrahepatic cholestasis of pregnancy, third trimester: Secondary | ICD-10-CM

## 2013-07-22 DIAGNOSIS — N390 Urinary tract infection, site not specified: Secondary | ICD-10-CM

## 2013-07-22 DIAGNOSIS — Z9889 Other specified postprocedural states: Secondary | ICD-10-CM

## 2013-07-22 DIAGNOSIS — K831 Obstruction of bile duct: Secondary | ICD-10-CM

## 2013-07-22 DIAGNOSIS — O0990 Supervision of high risk pregnancy, unspecified, unspecified trimester: Secondary | ICD-10-CM

## 2013-07-22 DIAGNOSIS — K838 Other specified diseases of biliary tract: Secondary | ICD-10-CM

## 2013-07-22 DIAGNOSIS — O26619 Liver and biliary tract disorders in pregnancy, unspecified trimester: Secondary | ICD-10-CM

## 2013-07-22 DIAGNOSIS — Z8742 Personal history of other diseases of the female genital tract: Secondary | ICD-10-CM | POA: Insufficient documentation

## 2013-07-22 DIAGNOSIS — O2390 Unspecified genitourinary tract infection in pregnancy, unspecified trimester: Secondary | ICD-10-CM

## 2013-07-22 HISTORY — DX: Obstruction of bile duct: K83.1

## 2013-07-22 HISTORY — DX: Intrahepatic cholestasis of pregnancy, third trimester: O26.643

## 2013-07-22 HISTORY — DX: Liver and biliary tract disorders in pregnancy, third trimester: O26.613

## 2013-07-22 LAB — POCT URINALYSIS DIP (DEVICE)
BILIRUBIN URINE: NEGATIVE
Bilirubin Urine: NEGATIVE
Glucose, UA: NEGATIVE mg/dL
Glucose, UA: NEGATIVE mg/dL
Hgb urine dipstick: NEGATIVE
Ketones, ur: NEGATIVE mg/dL
Ketones, ur: NEGATIVE mg/dL
LEUKOCYTES UA: NEGATIVE
Leukocytes, UA: NEGATIVE
Nitrite: NEGATIVE
Nitrite: NEGATIVE
Protein, ur: NEGATIVE mg/dL
Protein, ur: NEGATIVE mg/dL
Specific Gravity, Urine: 1.02 (ref 1.005–1.030)
Specific Gravity, Urine: 1.025 (ref 1.005–1.030)
Urobilinogen, UA: 0.2 mg/dL (ref 0.0–1.0)
Urobilinogen, UA: 0.2 mg/dL (ref 0.0–1.0)
pH: 7 (ref 5.0–8.0)
pH: 6 (ref 5.0–8.0)

## 2013-07-22 LAB — HEPATIC FUNCTION PANEL
ALT: 9 U/L (ref 0–35)
AST: 12 U/L (ref 0–37)
Albumin: 3.4 g/dL — ABNORMAL LOW (ref 3.5–5.2)
Alkaline Phosphatase: 109 U/L (ref 39–117)
Total Bilirubin: 0.2 mg/dL (ref 0.2–1.2)
Total Protein: 6.4 g/dL (ref 6.0–8.3)

## 2013-07-22 MED ORDER — URSODIOL 500 MG PO TABS: 500.0000 mg | ORAL_TABLET | Freq: Two times a day (BID) | ORAL | Status: DC

## 2013-07-22 MED ORDER — HYDROXYZINE HCL 25 MG PO TABS: 25.0000 mg | ORAL_TABLET | Freq: Four times a day (QID) | ORAL | Status: DC | PRN

## 2013-07-22 NOTE — Progress Notes (Signed)
Pulse- 94 Patient reports itching; patient reports pelvic pain/pressure; reports having a contraction last night; transfer from HD

## 2013-07-22 NOTE — Progress Notes (Signed)
Pt transferred for puritis and bile acids (11).  This is consistent with cholestasis of pregnancy.  Pt has rare Deberah PeltonBraxton Hicks.  No recurrent contractions.  Pt is transfer from health dept with no prenatal problems.  [facility]  Chart is under the media tab. Testing should begin today with delivery at 37 weeks.  Pt understands. Urine culture for proteus uti x2 (as TOC)

## 2013-07-23 LAB — PRESCRIPTION MONITORING PROFILE (19 PANEL)
Amphetamine/Meth: NEGATIVE ng/mL
BARBITURATE SCREEN, URINE: NEGATIVE ng/mL
BUPRENORPHINE, URINE: NEGATIVE ng/mL
Benzodiazepine Screen, Urine: NEGATIVE ng/mL
CANNABINOID SCRN UR: NEGATIVE ng/mL
Carisoprodol, Urine: NEGATIVE ng/mL
Cocaine Metabolites: NEGATIVE ng/mL
Creatinine, Urine: 72.92 mg/dL (ref >20.0)
Fentanyl, Ur: NEGATIVE ng/mL
MDMA URINE: NEGATIVE ng/mL
METHADONE SCREEN, URINE: NEGATIVE ng/mL
METHAQUALONE SCREEN (URINE): NEGATIVE ng/mL
Meperidine, Ur: NEGATIVE ng/mL
Nitrites, Initial: NEGATIVE ug/mL
OPIATE SCREEN, URINE: NEGATIVE ng/mL
Oxycodone Screen, Ur: NEGATIVE ng/mL
PHENCYCLIDINE, UR: NEGATIVE ng/mL
Propoxyphene: NEGATIVE ng/mL
TAPENTADOLUR: NEGATIVE ng/mL
Tramadol Scrn, Ur: NEGATIVE ng/mL
Zolpidem, Urine: NEGATIVE ng/mL
pH, Initial: 7.1 pH (ref 4.5–8.9)

## 2013-07-23 LAB — BILE ACIDS, TOTAL: Bile Acids Total: 11 umol/L (ref 0–19)

## 2013-07-25 LAB — CULTURE, OB URINE: Colony Count: >=100000

## 2013-07-26 ENCOUNTER — Ambulatory Visit (INDEPENDENT_AMBULATORY_CARE_PROVIDER_SITE_OTHER): Payer: Self-pay | Admitting: *Deleted

## 2013-07-26 VITALS — BP 108/58

## 2013-07-26 DIAGNOSIS — K831 Obstruction of bile duct: Secondary | ICD-10-CM

## 2013-07-26 DIAGNOSIS — O26619 Liver and biliary tract disorders in pregnancy, unspecified trimester: Secondary | ICD-10-CM

## 2013-07-26 DIAGNOSIS — O26613 Liver and biliary tract disorders in pregnancy, third trimester: Principal | ICD-10-CM

## 2013-07-26 DIAGNOSIS — K838 Other specified diseases of biliary tract: Secondary | ICD-10-CM

## 2013-07-26 LAB — US OB FOLLOW UP

## 2013-07-26 NOTE — Progress Notes (Signed)
P=79 

## 2013-07-27 ENCOUNTER — Other Ambulatory Visit: Payer: Self-pay | Admitting: Obstetrics & Gynecology

## 2013-07-27 DIAGNOSIS — N390 Urinary tract infection, site not specified: Secondary | ICD-10-CM

## 2013-07-27 MED ORDER — AMOXICILLIN 500 MG PO CAPS: 500.0000 mg | ORAL_CAPSULE | Freq: Three times a day (TID) | ORAL | Status: DC

## 2013-07-27 NOTE — Progress Notes (Signed)
Pt has persistent proteus uti.  Will need TOC in 1 week.  If still positive, consider curbside urology and keep on suppression.

## 2013-07-28 NOTE — Progress Notes (Signed)
Note made to repeat next week.

## 2013-07-28 NOTE — Progress Notes (Signed)
07/26/2013 NST reviewed and reactive

## 2013-07-29 ENCOUNTER — Encounter: Payer: Self-pay | Admitting: Obstetrics & Gynecology

## 2013-07-29 ENCOUNTER — Ambulatory Visit (INDEPENDENT_AMBULATORY_CARE_PROVIDER_SITE_OTHER): Payer: Self-pay | Admitting: Obstetrics & Gynecology

## 2013-07-29 ENCOUNTER — Telehealth: Payer: Self-pay | Admitting: *Deleted

## 2013-07-29 VITALS — BP 108/71 | Temp 98.4°F | Wt 167.4 lb

## 2013-07-29 DIAGNOSIS — K831 Obstruction of bile duct: Secondary | ICD-10-CM

## 2013-07-29 DIAGNOSIS — K838 Other specified diseases of biliary tract: Secondary | ICD-10-CM

## 2013-07-29 DIAGNOSIS — O26619 Liver and biliary tract disorders in pregnancy, unspecified trimester: Secondary | ICD-10-CM

## 2013-07-29 DIAGNOSIS — O0993 Supervision of high risk pregnancy, unspecified, third trimester: Secondary | ICD-10-CM

## 2013-07-29 DIAGNOSIS — O26613 Liver and biliary tract disorders in pregnancy, third trimester: Principal | ICD-10-CM

## 2013-07-29 LAB — POCT URINALYSIS DIP (DEVICE)
BILIRUBIN URINE: NEGATIVE
Glucose, UA: NEGATIVE mg/dL
Ketones, ur: NEGATIVE mg/dL
LEUKOCYTES UA: NEGATIVE
Nitrite: NEGATIVE
PH: 6.5 (ref 5.0–8.0)
PROTEIN: NEGATIVE mg/dL
Urobilinogen, UA: 0.2 mg/dL (ref 0.0–1.0)

## 2013-07-29 NOTE — Patient Instructions (Signed)
Tercer trimestre del embarazo  (Third Trimester of Pregnancy) El tercer trimestre del embarazo abarca desde la semana 29 hasta la semana 42, desde el 7 mes hasta el 9. En este trimestre el feto se desarrolla muy rpidamente. Hacia el final del noveno mes, el beb que an no ha nacido mide alrededor de 20 pulgadas (45 cm) de largo y pesa entre 6 y 10 libras (2,700 y 4,500 kg).  CAMBIOS CORPORALES  Su organismo atravesar numerosos cambios durante el embarazo. Los cambios varan de una mujer a otra.   Seguir aumentando de peso. Es esperable que aumente entre 25 y 35 libras (11 16 kg) hacia el final del embarazo.  Podrn aparecer las primeras estras en las caderas, abdomen y mamas.  Tendr necesidad de orinar con ms frecuencia porque el feto baja hacia la pelvis y presiona en la vejiga.  Como consecuencia del embarazo, podr sentir acidez estomacal continuamente.  Podr estar constipada ya que ciertas hormonas hacen que los msculos que hacen progresar los desechos a travs de los intestinos trabajen ms lentamente.  Pueden aparecer hemorroides o abultarse e hincharse las venas (venas varicosas).  Podr sentir dolor plvico debido al aumento de peso ya que las hormonas del embarazo relajan las articulaciones entre los huesos de la pelvis. El dolor de espalda puede ser consecuencia de la exigencia de los msculos que soportan la postura.  Sus mamas seguirn desarrollndose y estarn ms sensibles. A veces sale una secrecin amarilla de las mamas, que se llama calostro.  El ombligo puede salir hacia afuera.  Podr sentir que le falta el aire debido a que se expande el tero.  Podr notar que el feto "baja" o que se siente ms bajo en el abdomen.  Podr tener una prdida de secrecin mucosa con sangre. Esto suele ocurrir entre unos pocos das y una semana antes del parto.  El cuello se vuelve delgado y blando (se borra) cerca de la fecha de parto. QU DEBE ESPERAR EN LAS CONSULTAS  PRENATALES  Le harn exmenes prenatales cada 2 semanas hasta la semana 36. A partir de ese momento le harn exmenes semanales. Durante una visita prenatal de rutina:   La pesarn para verificar que usted y el feto se encuentran dentro de los lmites normales.  Le tomarn la presin arterial.  Le medirn el abdomen para verificar el desarrollo del beb.  Escucharn los latidos fetales.  Se evaluarn los resultados de los estudios solicitados en visitas anteriores.  Le controlarn el cuello del tero cuando est prxima la fecha de parto para ver si se ha borrado. Alrededor de la semana 36 el mdico controlar el cuello del tero. Al mismo tiempo realizar un anlisis de las secreciones del tejido vaginal. Este examen es para determinar si hay un tipo de bacteria, estreptococo Grupo B. El mdico le explicar esto con ms detalle.  El mdico podr preguntarle:   Como le gustara que fuera el parto.  Cmo se siente.  Si siente los movimientos del beb.  Si tiene sntomas anormales, como prdida de lquido, sangrado, dolores de cabeza intenso o clicos abdominales.  Si tiene alguna duda. Otros estudios que podrn realizarse durante el tercer trimestre son:   Anlisis de sangre para controlar sus niveles de hierro (anemia).  Controles fetales para determinar su salud, el nivel de actividad y su desarrollo. Si tiene alguna enfermedad o si tuvo problemas durante el embarazo, le harn estudios. FALSO TRABAJO DE PARTO  Es posible que sienta contracciones pequeas e irregulares que finalmente   desaparecen. Se llaman contracciones de Braxton Hicks o falso trabajo de parto. Las contracciones pueden durar horas, das o an semanas antes de que el verdadero trabajo de parto se inicie. Si las contracciones tienen intervalos regulares, se intensifican o se hacen dolorosas, lo mejor es que la revise su mdico.  SIGNOS DE TRABAJO DE PARTO   Espasmos del tipo menstrual.  Contracciones cada 5  minutos o menos.  Contracciones que comienzan en la parte superior del tero y se expanden hacia abajo, a la zona inferior del abdomen y la espalda.  Sensacin de presin que aumenta en la pelvis o dolor en la espalda.  Aparece una secrecin acuosa o sanguinolenta por la vagina. Si tiene alguno de estos signos antes de la semana 37 del embarazo, llame a su mdico inmediatamente. Debe concurrir al hospital para ser controlada inmediatamente.  INSTRUCCIONES PARA EL CUIDADO EN EL HOGAR   Evite fumar, consumir hierbas, beber alcohol y utilizar frmacos que no le hayan recetado. Estas sustancias qumicas afectan la formacin y el desarrollo del beb.  Siga las indicaciones del profesional con respecto a como tomar los medicamentos. Durante el embarazo, hay medicamentos que son seguros y otros no lo son.  Realice actividad fsica slo segn las indicaciones del mdico. Sentir clicos uterinos es el mejor signo para detener la actividad fsica.  Contine haciendo comidas regulares y sanas.  Use un sostn que le brinde buen soporte si sus mamas estn sensibles.  No utilice la baera con agua caliente, baos turcos o saunas.  Colquese el cinturn de seguridad cuando conduzca.  Evite comer carne cruda queso sin cocinar y el contacto con los utensilios y desperdicios de los gatos. Estos elementos contienen grmenes que pueden causar defectos de nacimiento en el beb.  Tome las vitaminas indicadas para la etapa prenatal.  Pruebe un laxante (si el mdico la autoriza) si tiene constipacin. Consuma ms alimentos ricos en fibra, como vegetales y frutas frescos y cereales enteros. Beba gran cantidad de lquido para mantener la orina de tono claro o amarillo plido.  Tome baos de agua tibia para calmar el dolor o las molestias causadas por las hemorroides. Use una crema para las hemorroides si el mdico la autoriza.  Si tiene venas varicosas, use medias de soporte. Eleve los pies durante 15 minutos,  3 4 veces por da. Limite el consumo de sal en su dieta.  Evite levantar objetos pesados, use zapatos de tacones bajos y mantenga una buena postura.  Descanse con las piernas elevadas si tiene calambres o dolor de cintura.  Visite a su dentista si no lo ha hecho durante el embarazo. Use un cepillo de dientes blando para higienizarse los dientes y use suavemente el hilo dental.  Puede continuar su vida sexual excepto que el mdico le indique otra cosa.  No haga viajes largos excepto que sea absolutamente necesario y slo con la aprobacin de su mdico.  Tome clases prenatales para entender, practicar y hacer preguntas sobre el trabajo de parto y el alumbramiento.  Haga un ensayo sobre la partida al hospital.  Prepare el bolso que llevar al hospital.  Prepare la habitacin del beb.  Contine concurriendo a todas las visitas prenatales segn las indicaciones de su mdico. SOLICITE ATENCIN MDICA SI:   No est segura si est en trabajo de parto o ha roto la bolsa de aguas.  Tiene mareos.  Siente clicos leves, presin en la pelvis o dolor persistente en el abdomen.  Tiene nuseas o vmitos persistentes.  Observa una   secrecin vaginal con mal olor.  Siente dolor al orinar. SOLICITE ATENCIN MDICA DE INMEDIATO SI:   Tiene fiebre.  Pierde lquido o sangre por la vagina.  Tiene sangrado o pequeas prdidas vaginales.  Siente dolor intenso o clicos en el abdomen.  Sube o baja de peso rpidamente.  Le falta el aire y le duele el pecho al respirar.  Sbitamente se le hincha el rostro, las manos, los tobillos, los pies o las piernas de manera extrema.  No ha sentido los movimientos del beb durante una hora.  Siente un dolor de cabeza intenso que no se alivia con medicamentos.  Su visin se modifica. Document Released: 03/13/2005 Document Revised: 02/03/2013 [employer] Patient Information 2014 [employer].  

## 2013-07-29 NOTE — Telephone Encounter (Deleted)
Called Dana Reed with [employer] at her home number and left a message we are calling to schedule a lab appointment for 08/05/13- please call clinic to discuss. Per chart had miscarriage , last bhcg 07/29/13 48.6. Per Dr. Jolayne Pantheronstant needs repeat bhcg one week. Also called mobile- heard busy signal.

## 2013-07-29 NOTE — Progress Notes (Signed)
Patient unaware of prescription for amoxicillin for persistent uti, notified her of uti and need for antibiotic. Will pick up and start today . C/o  Dark brownish vaginal discharge

## 2013-07-29 NOTE — Progress Notes (Signed)
Minimal itching, controlled by hydroxyzine. NST cat 1 reactive today

## 2013-07-29 NOTE — Progress Notes (Signed)
U/S scheduled 08/04/13 at 2 pm.

## 2013-08-02 ENCOUNTER — Other Ambulatory Visit: Payer: Self-pay | Admitting: *Deleted

## 2013-08-02 ENCOUNTER — Ambulatory Visit (INDEPENDENT_AMBULATORY_CARE_PROVIDER_SITE_OTHER): Payer: Self-pay | Admitting: *Deleted

## 2013-08-02 ENCOUNTER — Ambulatory Visit (HOSPITAL_COMMUNITY): Payer: Self-pay

## 2013-08-02 VITALS — BP 107/66 | Temp 97.8°F | Wt 169.8 lb

## 2013-08-02 DIAGNOSIS — O26619 Liver and biliary tract disorders in pregnancy, unspecified trimester: Secondary | ICD-10-CM

## 2013-08-02 DIAGNOSIS — K838 Other specified diseases of biliary tract: Secondary | ICD-10-CM

## 2013-08-02 DIAGNOSIS — O26613 Liver and biliary tract disorders in pregnancy, third trimester: Secondary | ICD-10-CM

## 2013-08-02 DIAGNOSIS — K831 Obstruction of bile duct: Secondary | ICD-10-CM

## 2013-08-03 NOTE — Telephone Encounter (Signed)
Deleted note due to chart review showing  patient being pregnant, no bhcg needed - wrong chart

## 2013-08-03 NOTE — Telephone Encounter (Deleted)
Per chart review this patient is pregnant- does not need bhcg

## 2013-08-03 NOTE — Telephone Encounter (Deleted)
[facility] we need her to come in for a lab draw only for 08/05/13- please call with time you can come in for a lab draw.

## 2013-08-04 ENCOUNTER — Other Ambulatory Visit: Payer: Self-pay | Admitting: Obstetrics & Gynecology

## 2013-08-04 ENCOUNTER — Ambulatory Visit (HOSPITAL_COMMUNITY)
Admission: RE | Admit: 2013-08-04 | Discharge: 2013-08-04 | Disposition: A | Discharge status: Home / Self Care | Payer: Medicaid Other | Source: Ambulatory Visit | Attending: Obstetrics & Gynecology | Admitting: Obstetrics & Gynecology

## 2013-08-04 DIAGNOSIS — Z363 Encounter for antenatal screening for malformations: Secondary | ICD-10-CM | POA: Insufficient documentation

## 2013-08-04 DIAGNOSIS — Z1389 Encounter for screening for other disorder: Secondary | ICD-10-CM | POA: Insufficient documentation

## 2013-08-04 DIAGNOSIS — O9989 Other specified diseases and conditions complicating pregnancy, childbirth and the puerperium: Secondary | ICD-10-CM

## 2013-08-04 DIAGNOSIS — O0993 Supervision of high risk pregnancy, unspecified, third trimester: Secondary | ICD-10-CM

## 2013-08-04 DIAGNOSIS — K831 Obstruction of bile duct: Secondary | ICD-10-CM

## 2013-08-04 DIAGNOSIS — O26613 Liver and biliary tract disorders in pregnancy, third trimester: Principal | ICD-10-CM

## 2013-08-04 DIAGNOSIS — K838 Other specified diseases of biliary tract: Secondary | ICD-10-CM | POA: Insufficient documentation

## 2013-08-04 DIAGNOSIS — O358XX0 Maternal care for other (suspected) fetal abnormality and damage, not applicable or unspecified: Secondary | ICD-10-CM | POA: Insufficient documentation

## 2013-08-04 DIAGNOSIS — O99891 Other specified diseases and conditions complicating pregnancy: Secondary | ICD-10-CM | POA: Insufficient documentation

## 2013-08-04 IMAGING — US US OB DETAIL+14 WK
1 series · 12 of 28 positions shown · non-contrast
Comparison: none

[Series 1: us ob comp +14 wk · 12 of 98 slices shown]
[im 4/98]
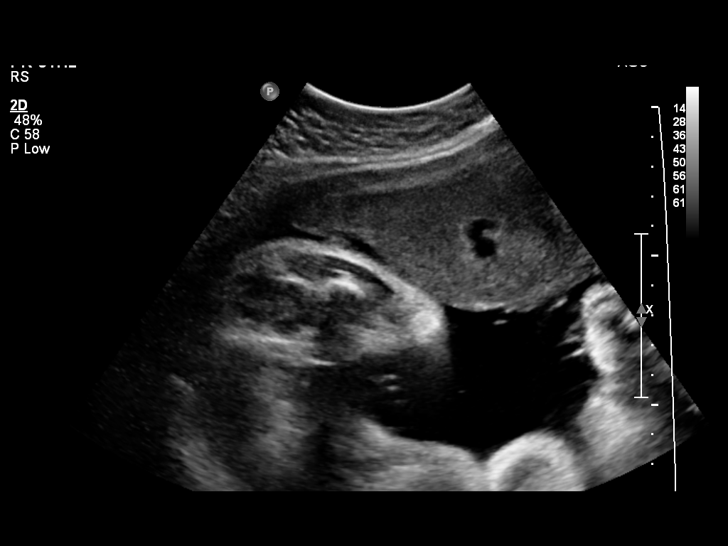
[im 11/98]
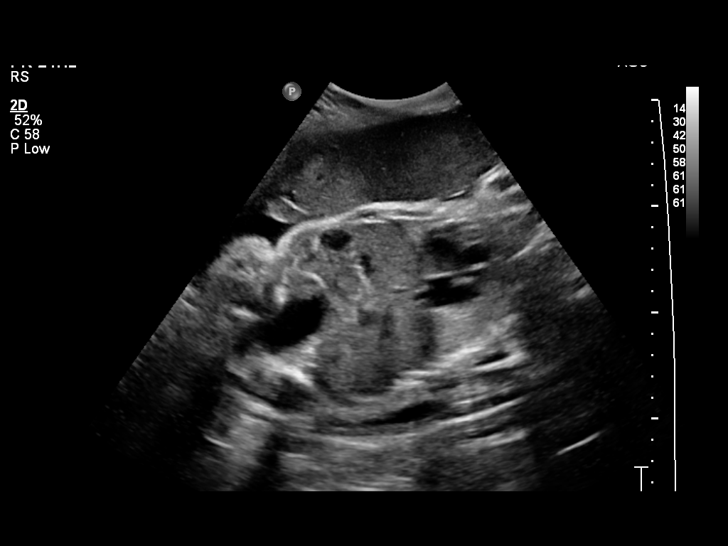
[im 18/98]
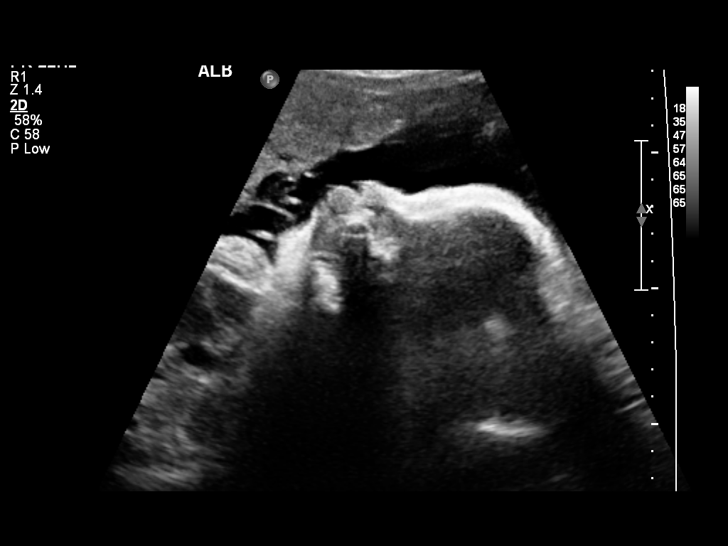
[im 29/98]
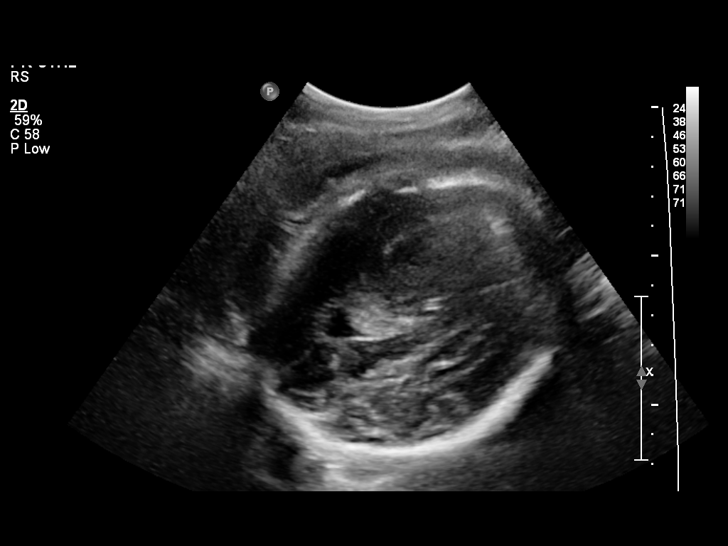
[im 36/98]
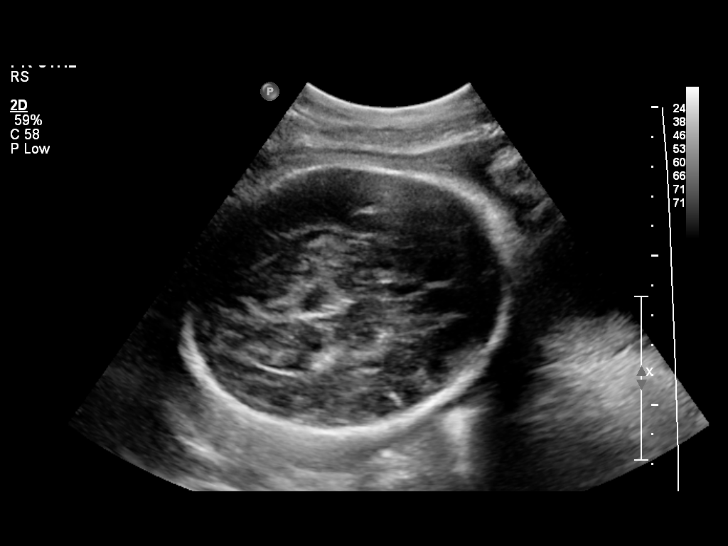
[im 44/98]
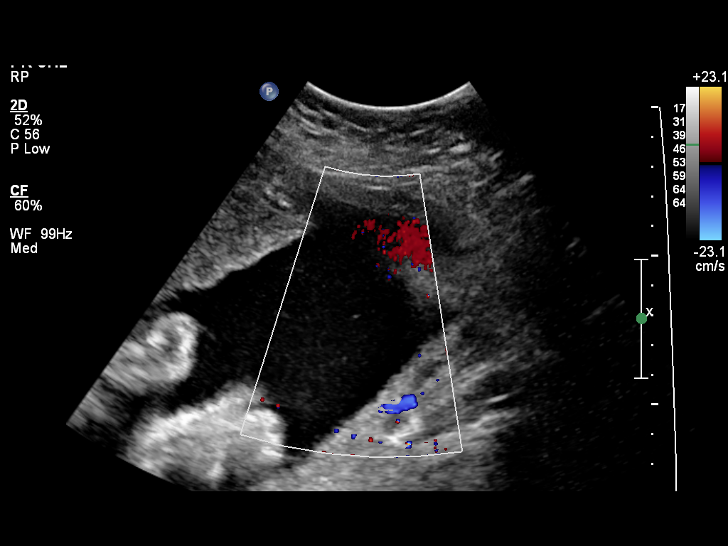
[im 54/98]
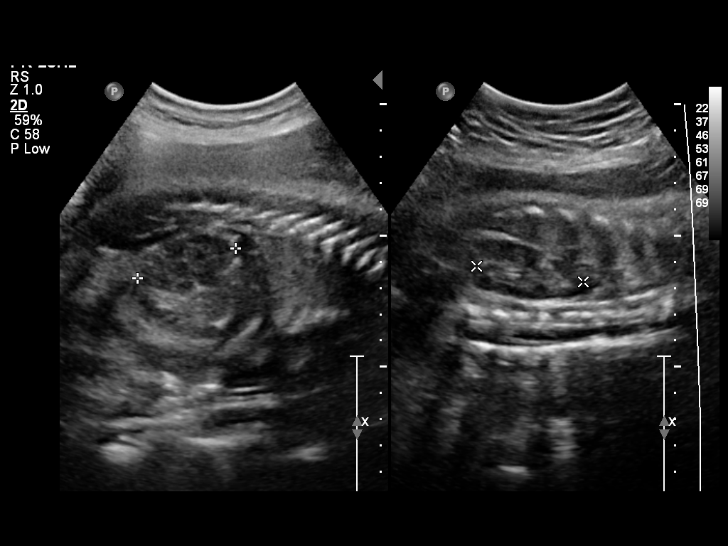
[im 62/98]
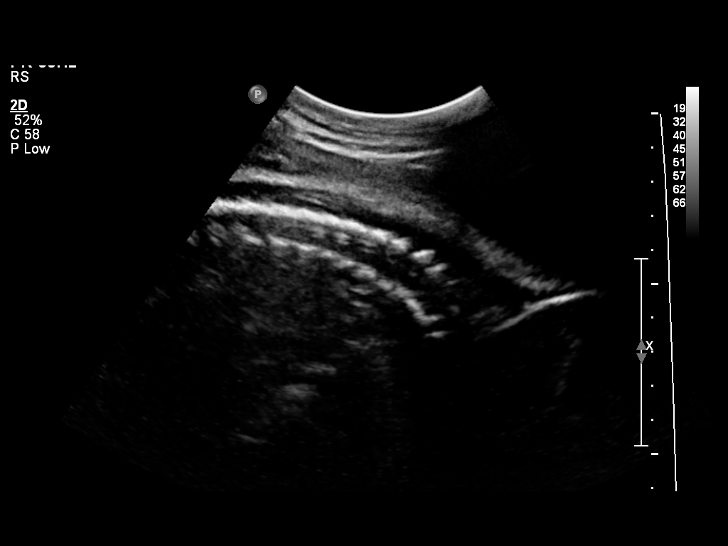
[im 69/98]
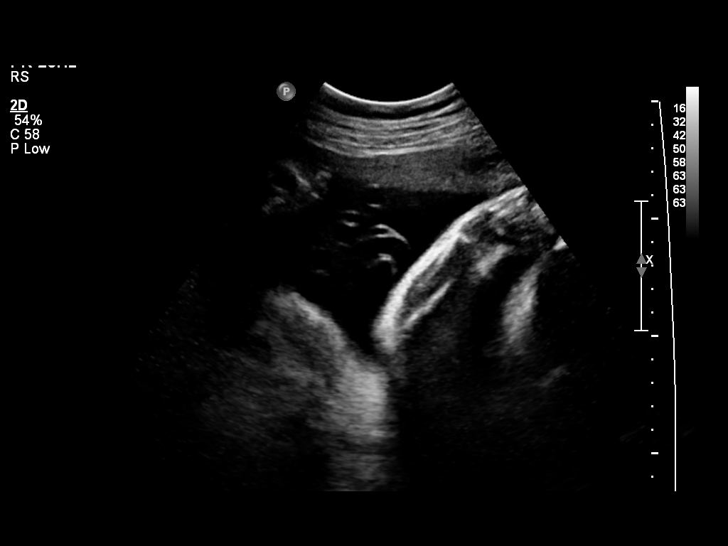
[im 80/98]
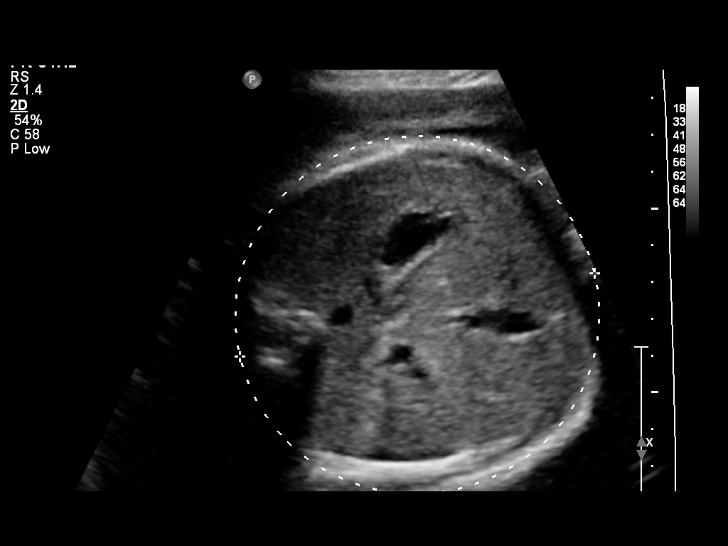
[im 87/98]
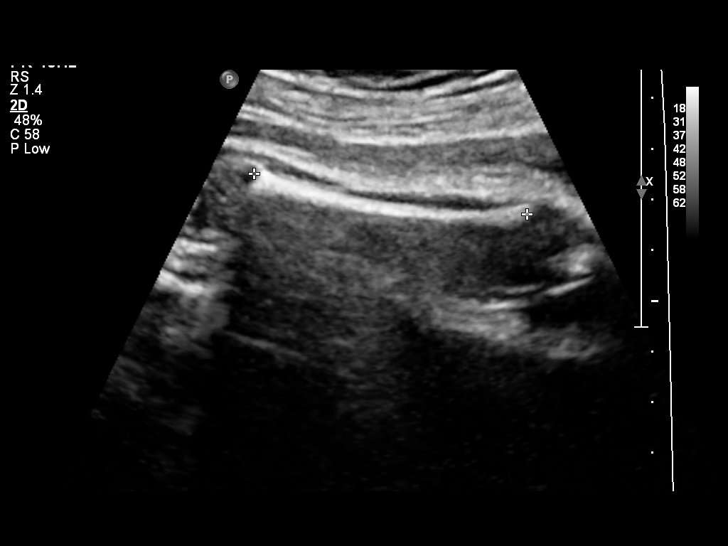
[im 94/98]
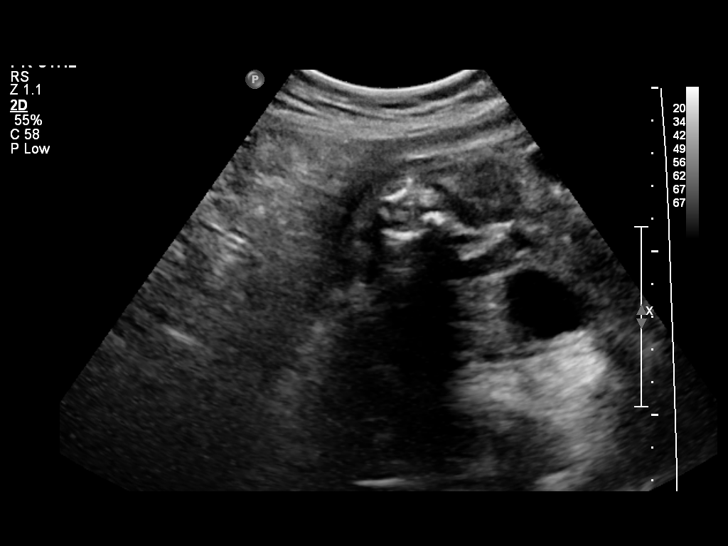

[12 of 28 positions shown; findings below may reference images not displayed]

OBSTETRICS REPORT
                      (Signed Final [DATE] [DATE])

             TEJUMOLA

Service(s) Provided

 US OB DETAIL + 14 WK`                                 76811.0
Indications

 Medical complication of pregnancy (cholestasis)
 Detailed fetal anatomic survey                        655.83 [DM]
Fetal Evaluation

 Num Of Fetuses:    1
 Fetal Heart Rate:  169                          bpm
 Cardiac Activity:  Observed
 Presentation:      Cephalic
 Placenta:          Anterior, above cervical os
 P. Cord            Visualized, central
 Insertion:

 Amniotic Fluid
 AFI FV:      Subjectively within normal limits
 AFI Sum:     19.14   cm       71  %Tile     Larg Pckt:     7.5  cm
 RLQ:   4.43    cm   LUQ:    7.5    cm    LLQ:   7.21    cm
Biometry

 BPD:     85.4  mm     G. Age:  34w 3d                CI:        66.86   70 - 86
                                                      FL/HC:      19.4   19.4 -

 HC:     334.6  mm     G. Age:  38w 2d     > 97  %    HC/AC:      1.07   0.96 -

 AC:     311.3  mm     G. Age:  35w 0d       81  %    FL/BPD:     75.9   71 - 87
 FL:      64.8  mm     G. Age:  33w 3d       25  %    FL/AC:      20.8   20 - 24
 HUM:     54.2  mm     G. Age:  31w 4d        7  %

 Est. FW:    [DM]  gm    5 lb 10 oz      76  %
Gestational Age

 LMP:           34w 0d        Date:  [DATE]                 EDD:   [DATE]
 U/S Today:     35w 2d                                        EDD:   [DATE]
 Best:          34w 0d     Det. By:  LMP  ([DATE])          EDD:   [DATE]
Anatomy

 Cranium:          Appears normal         Aortic Arch:      Appears normal
 Fetal Cavum:      Appears normal         Ductal Arch:      Appears normal
 Ventricles:       Appears normal         Diaphragm:        Appears normal
 Choroid Plexus:   Appears normal         Stomach:          Appears normal
 Cerebellum:       Appears normal         Abdomen:          Appears normal
 Posterior Fossa:  Appears normal         Abdominal Wall:   Appears nml (cord
                                                            insert, abd wall)
 Nuchal Fold:      Not applicable (>20    Cord Vessels:     Appears normal (3
                   wks GA)                                  vessel cord)
 Face:             Appears normal         Kidneys:          Appear normal
                   (orbits and profile)
 Lips:             Appears normal         Bladder:          Appears normal
 Heart:            Appears normal         Spine:            Appears normal
                   (4CH, axis, and
                   situs)
 RVOT:             Appears normal         Lower             Appears normal
                                          Extremities:
 LVOT:             Appears normal         Upper             Appears normal
                                          Extremities:

 Other:  Male gender. Technically difficult due to fetal position.
Cervix Uterus Adnexa

 Cervix:       Not visualized (advanced GA >[DM])
 Uterus:       No abnormality visualized.
 Cul De Sac:   No free fluid seen.
 Left Ovary:    Within normal limits.
 Right Ovary:   Within normal limits.

 Adnexa:     No abnormality visualized.
Impression

 Single IUP at 34 0/7 weeks
 Fetal anatomy somewhat limited due to late gestational age
 No gross anomalies noted
 Fetal growth is appropriate (76th %tile)
 Normal amniotic fluid volume
Recommendations

 Recommend 2x weekly NSTs with weekly AFIs
 Induction of labor at 37 weeks (due to cholestasis of
 pregnancy)
 Follow-up ultrasounds as clinically indicated.

## 2013-08-04 NOTE — Progress Notes (Signed)
NST 08/02/13 reactive 

## 2013-08-05 ENCOUNTER — Ambulatory Visit (INDEPENDENT_AMBULATORY_CARE_PROVIDER_SITE_OTHER): Payer: Self-pay | Admitting: Obstetrics & Gynecology

## 2013-08-05 VITALS — BP 99/63 | Temp 96.9°F | Wt 171.2 lb

## 2013-08-05 DIAGNOSIS — O26619 Liver and biliary tract disorders in pregnancy, unspecified trimester: Secondary | ICD-10-CM

## 2013-08-05 DIAGNOSIS — O0993 Supervision of high risk pregnancy, unspecified, third trimester: Secondary | ICD-10-CM

## 2013-08-05 LAB — POCT URINALYSIS DIP (DEVICE)
BILIRUBIN URINE: NEGATIVE
GLUCOSE, UA: 100 mg/dL — AB
HGB URINE DIPSTICK: NEGATIVE
KETONES UR: NEGATIVE mg/dL
Leukocytes, UA: NEGATIVE
Nitrite: NEGATIVE
Protein, ur: NEGATIVE mg/dL
SPECIFIC GRAVITY, URINE: 1.025 (ref 1.005–1.030)
Urobilinogen, UA: 0.2 mg/dL (ref 0.0–1.0)
pH: 6 (ref 5.0–8.0)

## 2013-08-05 NOTE — Addendum Note (Signed)
Addended by: Candelaria StagersHAIZLIP, Machi Whittaker E on: 08/05/2013 12:11 PM   Modules accepted: Orders

## 2013-08-05 NOTE — Progress Notes (Signed)
Pulse- 102 Patient reports menstrual like cramps

## 2013-08-05 NOTE — Progress Notes (Signed)
No problems.  Itching is better with meds.  Continue 2x week testing for cholestasis of pregnancy.

## 2013-08-09 ENCOUNTER — Ambulatory Visit (INDEPENDENT_AMBULATORY_CARE_PROVIDER_SITE_OTHER): Payer: Self-pay | Admitting: *Deleted

## 2013-08-09 VITALS — BP 101/60

## 2013-08-09 DIAGNOSIS — O26613 Liver and biliary tract disorders in pregnancy, third trimester: Principal | ICD-10-CM

## 2013-08-09 DIAGNOSIS — K838 Other specified diseases of biliary tract: Secondary | ICD-10-CM

## 2013-08-09 DIAGNOSIS — O26619 Liver and biliary tract disorders in pregnancy, unspecified trimester: Secondary | ICD-10-CM

## 2013-08-09 DIAGNOSIS — K831 Obstruction of bile duct: Secondary | ICD-10-CM

## 2013-08-09 NOTE — Progress Notes (Signed)
P-89 

## 2013-08-09 NOTE — Progress Notes (Signed)
2/23 NST reviewed and reactive 

## 2013-08-12 ENCOUNTER — Other Ambulatory Visit: Payer: Self-pay

## 2013-08-16 ENCOUNTER — Ambulatory Visit (INDEPENDENT_AMBULATORY_CARE_PROVIDER_SITE_OTHER): Payer: Self-pay | Admitting: *Deleted

## 2013-08-16 VITALS — BP 104/61

## 2013-08-16 DIAGNOSIS — K831 Obstruction of bile duct: Secondary | ICD-10-CM

## 2013-08-16 DIAGNOSIS — O26619 Liver and biliary tract disorders in pregnancy, unspecified trimester: Secondary | ICD-10-CM

## 2013-08-16 DIAGNOSIS — O26613 Liver and biliary tract disorders in pregnancy, third trimester: Principal | ICD-10-CM

## 2013-08-16 DIAGNOSIS — K838 Other specified diseases of biliary tract: Secondary | ICD-10-CM

## 2013-08-16 NOTE — Progress Notes (Signed)
P = 98 

## 2013-08-16 NOTE — Progress Notes (Signed)
NST reviewed and reactive.  Airika Alkhatib L. Harraway-Smith, M.D., FACOG    

## 2013-08-17 ENCOUNTER — Encounter (HOSPITAL_COMMUNITY): Payer: Self-pay | Admitting: *Deleted

## 2013-08-17 ENCOUNTER — Inpatient Hospital Stay (HOSPITAL_COMMUNITY)
Admission: AD | Admit: 2013-08-17 | Discharge: 2013-08-17 | Disposition: A | Discharge status: Home / Self Care | Payer: Self-pay | Source: Ambulatory Visit | Attending: Obstetrics and Gynecology | Admitting: Obstetrics and Gynecology

## 2013-08-17 DIAGNOSIS — O26619 Liver and biliary tract disorders in pregnancy, unspecified trimester: Secondary | ICD-10-CM | POA: Insufficient documentation

## 2013-08-17 DIAGNOSIS — O9989 Other specified diseases and conditions complicating pregnancy, childbirth and the puerperium: Secondary | ICD-10-CM

## 2013-08-17 DIAGNOSIS — K838 Other specified diseases of biliary tract: Secondary | ICD-10-CM | POA: Insufficient documentation

## 2013-08-17 DIAGNOSIS — R12 Heartburn: Secondary | ICD-10-CM | POA: Insufficient documentation

## 2013-08-17 DIAGNOSIS — O99891 Other specified diseases and conditions complicating pregnancy: Secondary | ICD-10-CM | POA: Insufficient documentation

## 2013-08-17 DIAGNOSIS — O0993 Supervision of high risk pregnancy, unspecified, third trimester: Secondary | ICD-10-CM

## 2013-08-17 DIAGNOSIS — K219 Gastro-esophageal reflux disease without esophagitis: Secondary | ICD-10-CM

## 2013-08-17 LAB — COMPREHENSIVE METABOLIC PANEL
ALT: 10 U/L (ref 0–35)
AST: 13 U/L (ref 0–37)
Albumin: 2.9 g/dL — ABNORMAL LOW (ref 3.5–5.2)
Alkaline Phosphatase: 153 U/L — ABNORMAL HIGH (ref 39–117)
BUN: 8 mg/dL (ref 6–23)
CALCIUM: 9.1 mg/dL (ref 8.4–10.5)
CO2: 21 meq/L (ref 19–32)
Chloride: 100 mEq/L (ref 96–112)
Creatinine, Ser: 0.38 mg/dL — ABNORMAL LOW (ref 0.50–1.10)
GFR calc Af Amer: >90 mL/min (ref >90)
Glucose, Bld: 79 mg/dL (ref 70–99)
Potassium: 4 mEq/L (ref 3.7–5.3)
SODIUM: 135 meq/L — AB (ref 137–147)
Total Protein: 6.6 g/dL (ref 6.0–8.3)

## 2013-08-17 LAB — URINALYSIS, ROUTINE W REFLEX MICROSCOPIC
Bilirubin Urine: NEGATIVE
GLUCOSE, UA: NEGATIVE mg/dL
Ketones, ur: NEGATIVE mg/dL
LEUKOCYTES UA: NEGATIVE
Nitrite: NEGATIVE
PROTEIN: NEGATIVE mg/dL
SPECIFIC GRAVITY, URINE: 1.01 (ref 1.005–1.030)
Urobilinogen, UA: 0.2 mg/dL (ref 0.0–1.0)
pH: 6.5 (ref 5.0–8.0)

## 2013-08-17 LAB — CBC
HCT: 32 % — ABNORMAL LOW (ref 36.0–46.0)
HEMOGLOBIN: 10.6 g/dL — AB (ref 12.0–15.0)
MCH: 25.7 pg — AB (ref 26.0–34.0)
MCHC: 33.1 g/dL (ref 30.0–36.0)
MCV: 77.5 fL — AB (ref 78.0–100.0)
PLATELETS: 241 10*3/uL (ref 150–400)
RBC: 4.13 MIL/uL (ref 3.87–5.11)
RDW: 14 % (ref 11.5–15.5)
WBC: 8.9 10*3/uL (ref 4.0–10.5)

## 2013-08-17 LAB — URINE MICROSCOPIC-ADD ON

## 2013-08-17 LAB — LIPASE, BLOOD: Lipase: 23 U/L (ref 11–59)

## 2013-08-17 MED ORDER — OMEPRAZOLE 40 MG PO CPDR: 40.0000 mg | DELAYED_RELEASE_CAPSULE | Freq: Every day | ORAL | Status: DC

## 2013-08-17 MED ORDER — RANITIDINE HCL 150 MG PO TABS: 150.0000 mg | ORAL_TABLET | Freq: Two times a day (BID) | ORAL | Status: DC

## 2013-08-17 MED ORDER — GI COCKTAIL ~~LOC~~
30.0000 mL | Freq: Once | ORAL | Status: AC
  Administered 2013-08-17: 30 mL via ORAL
  Filled 2013-08-17: qty 30

## 2013-08-17 NOTE — MAU Note (Signed)
WITH INTERPRETER- JESSICA-   PT SAYS SHE HAS PAIN IN HER STOMACH  THAT SHE HAS HEARTBURN- AND WAS TOLD TO TAKE TUMS IN CLINIC.   TAKES ATIGAL- SAYS MED MAKES HER WORSE.   DOES NOT EAT  BECAUSE OF PAIN AND NAUSEA.  HAS H/A- TAKES TYLENOL- HAS HX OF MIGRAINES.

## 2013-08-17 NOTE — Discharge Instructions (Signed)
Acidez de estmago durante el embarazo  (Heartburn During Pregnancy ) La acidez es la sensacin de ardor en el pecho que se siente cuando el cido del estmago vuelve haca el esfago. La acidez es frecuente en el embarazo debido a la liberacin de cierta hormona (progesterona). La progesterona relaja la vlvula que separa el esfago del [place]. Esto hace que el cido suba al esfago y cause acidez. Tambin puede ocurrir Geologist, engineering debido a que el tero al agrandarse empuja el estmago, lo que hace que suba ms cido al esfago. Esto se produce especialmente en las ltimas etapas del embarazo. La acidez generalmente desaparece despus del parto. CAUSAS  La acidez se siente cuando el cido del estmago vuelve hacia el esfago. Durante el [place], puede ser causada por distintas cosas, por ejemplo:   La hormona progesterona.  Cambios en los niveles hormonales.  El tero crece y empuja el cido del estmago [place].  Comidas abundantes  Ciertos alimentos y [address] fsica.  Aumento en la produccin de cido SIGNOS Y SNTOMAS   Sensacin de ardor en el pecho o en la parte inferior de la garganta.  Sabor amargo en la boca.  Tos. DIAGNSTICO  El mdico diagnostica la Victorio Palm con una historia clnica cuidadosa en la que pregunta por sus molestias. Le indicar anlisis de sangre para Pension scheme manager cierto tipo de bacteria que se asocia con la [place]. En algunos casos se diagnostica recetando un medicamento para calmar la acidez y viendo si los sntomas mejoran. En algunos casos, se realiza un procedimiento llamado endoscopa. En este procedimiento se [place] un tubo con Ardelia Mems luz y Carlota Raspberry en un extremo (endoscopio) , y se examina el esfago y Product manager. TRATAMIENTO  El tratamiento variar segn la gravedad de los sntomas. El mdico podr indicar:  Medicamentos de Radio broadcast assistant (anticidos, medicamentos para Armed forces technical officer Geographical information systems officer) en los casos de acidez leve.  Medicamentos  recetados para disminuir el cido estomacal o para proteger la superficie del [place].  Ciertos cambios en la dieta.  Elevacin de la cabecera de la cama colocando bloques debajo de las patas. De esta manera evitar que el cido del estmago vuelva al esfago mientras est recostado. [place] slo medicamentos de venta libre o recetados, segn las indicaciones del mdico.  Eleve la cabecera de la cama colocando bloques debajo de las patas, si el mdico lo aconsej. Usar ms almohadas al dormir no es Occupational psychologist porque solo modificara la posicin de la cabeza.  No  haga ejercicios enseguida despus de comer.  Evite comer 2 o 3horas antes de irse a dormir. No se acueste enseguida despus de comer.  Haga comidas pequeas Medical sales representative de tres comidas abundantes.  Identifique los alimentos o las bebidas que empeoran sus sntomas y evtelos. Los alimentos que debe evitar son:  [place].  Chocolate.  Alimentos con alto contenido de grasas, incluyendo las comidas fritas  Comidas muy condimentadas.  Ajo y cebolla  Ctricos, como naranja, pomelo, limn y lima  Alimentos o productos que contengan tomate  Menta.  Bebidas gaseosas y con cafena.  [place] SOLICITE ATENCIN MDICA SI:  Tiene cualquier tipo de dolor abdominal.  Siente ardor en la parte superior del abdomen o el pecho, especialmente despus de comer o mientras est acostada.  Tiene nuseas o vmitos.  Siente malestar estomacal despus de comer. SOLICITE ATENCIN MDICA DE INMEDIATO SI:   Siente un dolor intenso en el pecho  que baja por el brazo o va hacia al mandbula o el cuello.  Se siente mareado o sufre un desmayo.  Comienza a sentir falta de aire.  Vomita sangre.  Tiene dificultad o dolor al tragar.  La materia fecal es negra, de aspecto alquitranado.  Tiene acidez ms de 3 veces por semana, durante ms de 2 semanas. ASEGRESE DE QUE:  Comprende  estas instrucciones.  Controlar su afeccin.  Recibir ayuda de inmediato si no mejora o si empeora. Document Released: 03/13/2005 Document Revised: 03/24/2013 [facility] Patient Information 2014 [place], [place].  Dieta para el reflujo gastroesofgico - Adultos  (Diet for Gastroesophageal Reflux Disease, Adult)  El reflujo (reflujo cido) ocurre cuando el cido del estmago pasa al esfago. Cuando el cido entra en contacto con el esfago, el cido provoca dolor e irritacin (inflamacin) en el esfago. Cuando el reflujo ocurre a menudo o es tan grave que causa dao en el esfago, se denomina enfermedad por reflujo gastroesofgico (ERGE). La terapia nutricional puede ayudar a Acupuncturist de la [place].  ALIMENTOS O BEBIDAS QUE DEBE EVITAR O LIMITAR   Fumar o consumir tabaco. La nicotina es uno de los estimulantes ms potentes en la produccin de cido en el tracto gastrointestinal.  Caf y t negro con cafena o descafeinado.  Gaseosas comunes o bajas caloras o bebidas energizantes (las gaseosas sin cafena estn permitidas).   Especias picantes, como la pimienta negra, pimienta blanca, pimienta roja, pimienta de cayena, curry en polvo,y [place] en polvo.  Menta y mentol.  Chocolate.  Alimentos con alto contenido de grasas, incluyendo las carnes y comidas fritas. El agregado de [place] extra, por ejemplo aceite, [place], aderezo para ensaladas y nueces. Limite estos alimentos a menos de 8 cucharaditas por da.  Las frutas y verduras si no son toleradas, tales como frutas ctricas o tomates.  El alcohol.  Todo alimento que agrave el trastorno. Si tiene dudas relacionadas con la dieta, comunquese con el profesional que lo asiste o con un nutricionista matriculado.  OTROS FACTORES QUE PUEDEN ALIVIAR EL ERGE SON:   Comer lentamente, en un clima distendido.  Hacer 5 o 6 comidas pequeas por da en vez de tres grandes.  Suprimir por un [employer] alimentos que causen  problemas.  No acostarse hasta despus de 3 horas de haber comido.  Mantener la cabeza elevada 6 a 9 pulgadas (15 a 23 cm) usando una cua de espuma o bloques debajo de las patas de la cama. Si permanece en una postura plana har empeorar los sntomas.  Mantngase fsicamente activo. Perder peso puede ser de ayuda para reducir el [employer] adultos obesos o con sobrepeso.  Use ropas sueltas. EJEMPLO DE UN PLAN DE ALIMENTACIN  Este plan de alimentacin consiste en aproximadamente 2 000 caloras, segn las guas de alimentacin de https://www.bernard.org/.  Desayuno   taza de avena cocida.  1 porcin de fresas.  1 taza de [employer].  1 oz de almendras. Colacin  1 taza de rebanadas de pepino.  6 oz de yogur (elaborado con [employer] con bajo contenido de grasas o descremada). Almuerzo:  2 rebanada de pan integral.  2 oz de rebanadas de pavo.  2 cucharaditas de mayonesa.  1 taza de arndanos.  1 taza de guisantes. Colacin  6 crackers integrales.  1 oz ( 28 g) de queso en hebras. Cena   taza de arroz integral.  1 taza de vegetales variados.  1 cucharadita de aceite de oliva.  3 oz ( 84 g)  de pescado grill. Document Released: 03/13/2005 Document Revised: 08/26/2011 [facility] Patient Information 2014 [place], [place].

## 2013-08-17 NOTE — MAU Note (Signed)
Pt reports heartburn/reflux, reports she was told to use Tums and Zantac but it is not helping.

## 2013-08-17 NOTE — MAU Provider Note (Signed)
Chief Complaint:  Heartburn   Dana Reed is a 23 y.o.  G2P1001 with IUP at [redacted]w[redacted]d presenting for Heartburn She has had epigastric burning that radiates to her throat for the past 3 weeks. She was seen in clinic and advised to take tums and Zantac, but the pain has been getting worse and now she has decreased PO intake due to the pain. Additionally she reports nausea w/o vomiting, hx of mild HA, and itching. She has not been taking her ursodiol because this makes her heart burn worse.    irregular contractions, but denies VB or LOF. She still notes good fetal movement.  No current headache, vision blurry at baseline and unchanged, no edema.   Menstrual History: OB History   Grav Para Term Preterm Abortions TAB SAB Ect Mult Living   2 1 1  0 0 0 0 0 0 1      Patient's last menstrual period was 12/09/2012.      Past Medical History  Diagnosis Date  . Hx of migraines     duration = 1 year  . No pertinent past medical history   . Cholestasis of pregnancy in third trimester 07/22/2013    Testing at 32 weeks. Delivery at 37 weeks, meds     Past Surgical History  Procedure Laterality Date  . Laparoscopic ovarian cystectomy  2007    No family history on file.  History  Substance Use Topics  . Smoking status: Never Smoker   . Smokeless tobacco: Never Used  . Alcohol Use: No     Comment: "I've only had a drink once ever."     No Known Allergies  Prescriptions prior to admission  Medication Sig Dispense Refill  . calcium carbonate (TUMS - DOSED IN MG ELEMENTAL CALCIUM) 500 MG chewable tablet Chew 2 tablets by mouth as needed for indigestion or heartburn.      . hydrOXYzine (ATARAX/VISTARIL) 25 MG tablet Take 25 mg by mouth every 6 (six) hours as needed for itching.      . Prenatal Vit-Min-FA-Fish Oil ([facility] PRENATAL GUMMY PO) Take 2 tablets by mouth daily.      . ursodiol (ACTIGALL) 500 MG tablet Take 500 mg by mouth 2 (two) times daily.      . [DISCONTINUED] hydrOXYzine  (ATARAX/VISTARIL) 25 MG tablet Take 1 tablet (25 mg total) by mouth every 6 (six) hours as needed for itching.  90 tablet  2  . [DISCONTINUED] ursodiol (ACTIGALL) 500 MG tablet Take 1 tablet (500 mg total) by mouth 2 (two) times daily.  60 tablet  3    Review of Systems - Negative except for what is mentioned in HPI.  Physical Exam  Blood pressure 103/68, pulse 71, temperature 97.5 F (36.4 C), temperature source Oral, resp. rate 18, height 5\' 4"  (1.626 m), weight 78.019 kg (172 lb), last menstrual period 12/09/2012, SpO2 99.00%. GENERAL: Well-developed, well-nourished female in no acute distress.  LUNGS: Clear to auscultation bilaterally.  HEART: Regular rate and rhythm. ABDOMEN: Soft, Gravid, mild epigastric tenderness  EXTREMITIES: Nontender, no edema, 2+ distal pulses.  FHT:  Baseline rate 140 bpm   Variability moderate  Accelerations present   Decelerations none Contractions: irregular    Labs: Results for orders placed during the hospital encounter of 08/17/13 (from the past 24 hour(s))  URINALYSIS, ROUTINE W REFLEX MICROSCOPIC   Collection Time    08/17/13  8:12 PM      Result Value Ref Range   Color, Urine YELLOW  YELLOW  APPearance CLEAR  CLEAR   Specific Gravity, Urine 1.010  1.005 - 1.030   pH 6.5  5.0 - 8.0   Glucose, UA NEGATIVE  NEGATIVE mg/dL   Hgb urine dipstick TRACE (*) NEGATIVE   Bilirubin Urine NEGATIVE  NEGATIVE   Ketones, ur NEGATIVE  NEGATIVE mg/dL   Protein, ur NEGATIVE  NEGATIVE mg/dL   Urobilinogen, UA 0.2  0.0 - 1.0 mg/dL   Nitrite NEGATIVE  NEGATIVE   Leukocytes, UA NEGATIVE  NEGATIVE  URINE MICROSCOPIC-ADD ON   Collection Time    08/17/13  8:12 PM      Result Value Ref Range   Squamous Epithelial / LPF RARE  RARE   WBC, UA 0-2  <3 WBC/hpf   RBC / HPF 0-2  <3 RBC/hpf   Bacteria, UA RARE  RARE  CBC   Collection Time    08/17/13  9:55 PM      Result Value Ref Range   WBC 8.9  4.0 - 10.5 K/uL   RBC 4.13  3.87 - 5.11 MIL/uL   Hemoglobin 10.6  (*) 12.0 - 15.0 g/dL   HCT 54.032.0 (*) 98.136.0 - 19.146.0 %   MCV 77.5 (*) 78.0 - 100.0 fL   MCH 25.7 (*) 26.0 - 34.0 pg   MCHC 33.1  30.0 - 36.0 g/dL   RDW 47.814.0  29.511.5 - 62.115.5 %   Platelets 241  150 - 400 K/uL  COMPREHENSIVE METABOLIC PANEL   Collection Time    08/17/13  9:55 PM      Result Value Ref Range   Sodium 135 (*) 137 - 147 mEq/L   Potassium 4.0  3.7 - 5.3 mEq/L   Chloride 100  96 - 112 mEq/L   CO2 21  19 - 32 mEq/L   Glucose, Bld 79  70 - 99 mg/dL   BUN 8  6 - 23 mg/dL   Creatinine, Ser 3.080.38 (*) 0.50 - 1.10 mg/dL   Calcium 9.1  8.4 - 65.710.5 mg/dL   Total Protein 6.6  6.0 - 8.3 g/dL   Albumin 2.9 (*) 3.5 - 5.2 g/dL   AST 13  0 - 37 U/L   ALT 10  0 - 35 U/L   Alkaline Phosphatase 153 (*) 39 - 117 U/L   Total Bilirubin <0.2 (*) 0.3 - 1.2 mg/dL   GFR calc non Af Amer >90  >90 mL/min   GFR calc Af Amer >90  >90 mL/min   Imaging Studies:  [place] Ob Detail + 14 Wk  08/04/2013   OBSTETRICAL ULTRASOUND: This exam was performed within a [facility]. The OB US report was generated in the AS system, and faxed to the ordering physician.   This report is also available in [facility] [employer] and in the [facility] PACS. See AS Obstetric US report.  Assessment: Dana Reed is  23 y.o. G2P1001 at 359w6d presents with Heartburn LFTs, bili, Cr and Lipase are wnl. Inc suspicion for worsening GERD over issues with cholestasis.  Plan: GERD - Start Omeprazole 40 mg qd  - Inc Zantac to 150 BID - Continue Tums as needed - f/u at next appt.  Cholestasis of pregnancy - Continue Ursodiol  - continue BPP next on thrusday  Labor precautions reviewed  Wenda LowJoyner, James 3/3/201511:20 PM  Assisted with spanish interpreter  I spoke with and examined patient and agree with resident's note and plan of care.  Tawana ScaleMichael Ryan Anamari Galeas, MD OB Fellow 08/17/2013 11:29 PM

## 2013-08-18 NOTE — MAU Provider Note (Signed)
Attestation of Attending Supervision of Advanced Practitioner (CNM/NP): Evaluation and management procedures were performed by the Advanced Practitioner under my supervision and collaboration.  I have reviewed the Advanced Practitioner's note and chart, and I agree with the management and plan.  Latanya Hemmer 08/18/2013 8:00 AM

## 2013-08-19 ENCOUNTER — Ambulatory Visit (INDEPENDENT_AMBULATORY_CARE_PROVIDER_SITE_OTHER): Payer: Self-pay | Admitting: Obstetrics & Gynecology

## 2013-08-19 VITALS — BP 112/69 | Wt 172.3 lb

## 2013-08-19 DIAGNOSIS — O26613 Liver and biliary tract disorders in pregnancy, third trimester: Principal | ICD-10-CM

## 2013-08-19 DIAGNOSIS — O2390 Unspecified genitourinary tract infection in pregnancy, unspecified trimester: Secondary | ICD-10-CM

## 2013-08-19 DIAGNOSIS — K831 Obstruction of bile duct: Secondary | ICD-10-CM

## 2013-08-19 DIAGNOSIS — K838 Other specified diseases of biliary tract: Secondary | ICD-10-CM

## 2013-08-19 DIAGNOSIS — O26619 Liver and biliary tract disorders in pregnancy, unspecified trimester: Secondary | ICD-10-CM

## 2013-08-19 LAB — POCT URINALYSIS DIP (DEVICE)
Bilirubin Urine: NEGATIVE
Glucose, UA: NEGATIVE mg/dL
Ketones, ur: NEGATIVE mg/dL
NITRITE: NEGATIVE
PH: 6.5 (ref 5.0–8.0)
PROTEIN: 30 mg/dL — AB
Specific Gravity, Urine: >=1.03 (ref 1.005–1.030)
UROBILINOGEN UA: 0.2 mg/dL (ref 0.0–1.0)

## 2013-08-19 LAB — OB RESULTS CONSOLE GBS: STREP GROUP B AG: NEGATIVE

## 2013-08-19 NOTE — Progress Notes (Signed)
IOL scheduled 08/25/13 at 730 pm.

## 2013-08-19 NOTE — Progress Notes (Signed)
Still notes itch. Continue actigall. NST .reactive. Schedule IOL 37 weeks for cholestasis  Wet prep sent for discharge and odor. GBS and CT GC

## 2013-08-19 NOTE — Progress Notes (Signed)
Pulse- 96 Patient reports a little pressure in her pelvis and occasional contractions

## 2013-08-20 ENCOUNTER — Telehealth (HOSPITAL_COMMUNITY): Payer: Self-pay | Admitting: *Deleted

## 2013-08-20 LAB — WET PREP, GENITAL
Clue Cells Wet Prep HPF POC: NONE SEEN
Trich, Wet Prep: NONE SEEN
Yeast Wet Prep HPF POC: NONE SEEN

## 2013-08-20 LAB — GC/CHLAMYDIA PROBE AMP
CT PROBE, AMP APTIMA: NEGATIVE
GC PROBE AMP APTIMA: NEGATIVE

## 2013-08-20 NOTE — Telephone Encounter (Signed)
Preadmission screen Interpreter number [id number] 

## 2013-08-22 LAB — CULTURE, BETA STREP (GROUP B ONLY)

## 2013-08-23 ENCOUNTER — Telehealth (HOSPITAL_COMMUNITY): Payer: Self-pay | Admitting: *Deleted

## 2013-08-23 ENCOUNTER — Other Ambulatory Visit: Payer: Self-pay

## 2013-08-23 ENCOUNTER — Encounter (HOSPITAL_COMMUNITY): Payer: Self-pay | Admitting: *Deleted

## 2013-08-23 NOTE — Telephone Encounter (Signed)
Preadmission screen Interpreter number [phone]

## 2013-08-25 ENCOUNTER — Ambulatory Visit (INDEPENDENT_AMBULATORY_CARE_PROVIDER_SITE_OTHER): Payer: Self-pay | Admitting: *Deleted

## 2013-08-25 ENCOUNTER — Encounter (HOSPITAL_COMMUNITY): Payer: Self-pay

## 2013-08-25 ENCOUNTER — Inpatient Hospital Stay (HOSPITAL_COMMUNITY)
Admission: RE | Admit: 2013-08-25 | Discharge: 2013-08-27 | DRG: 775 | Disposition: A | Discharge status: Home / Self Care | Payer: Medicaid Other | Source: Ambulatory Visit | Attending: Obstetrics and Gynecology | Admitting: Obstetrics and Gynecology

## 2013-08-25 VITALS — BP 92/53 | HR 64 | Temp 97.6°F | Resp 18 | Ht 64.0 in | Wt 172.0 lb

## 2013-08-25 VITALS — BP 108/63

## 2013-08-25 DIAGNOSIS — O9902 Anemia complicating childbirth: Secondary | ICD-10-CM | POA: Diagnosis present

## 2013-08-25 DIAGNOSIS — K831 Obstruction of bile duct: Secondary | ICD-10-CM | POA: Diagnosis present

## 2013-08-25 DIAGNOSIS — O0993 Supervision of high risk pregnancy, unspecified, third trimester: Secondary | ICD-10-CM

## 2013-08-25 DIAGNOSIS — K838 Other specified diseases of biliary tract: Secondary | ICD-10-CM

## 2013-08-25 DIAGNOSIS — O26619 Liver and biliary tract disorders in pregnancy, unspecified trimester: Secondary | ICD-10-CM

## 2013-08-25 DIAGNOSIS — O26613 Liver and biliary tract disorders in pregnancy, third trimester: Principal | ICD-10-CM

## 2013-08-25 DIAGNOSIS — D649 Anemia, unspecified: Secondary | ICD-10-CM | POA: Diagnosis present

## 2013-08-25 HISTORY — DX: Headache: R51

## 2013-08-25 LAB — CBC
HCT: 31.2 % — ABNORMAL LOW (ref 36.0–46.0)
Hemoglobin: 10.3 g/dL — ABNORMAL LOW (ref 12.0–15.0)
MCH: 25.4 pg — AB (ref 26.0–34.0)
MCHC: 33 g/dL (ref 30.0–36.0)
MCV: 76.8 fL — ABNORMAL LOW (ref 78.0–100.0)
Platelets: 226 10*3/uL (ref 150–400)
RBC: 4.06 MIL/uL (ref 3.87–5.11)
RDW: 14.2 % (ref 11.5–15.5)
WBC: 7.9 10*3/uL (ref 4.0–10.5)

## 2013-08-25 LAB — TYPE AND SCREEN
ABO/RH(D): O POS
Antibody Screen: NEGATIVE

## 2013-08-25 MED ORDER — LACTATED RINGERS IV SOLN: 500.0000 mL | INTRAVENOUS | Status: DC | PRN

## 2013-08-25 MED ORDER — URSODIOL 300 MG PO CAPS
300.0000 mg | ORAL_CAPSULE | Freq: Once | ORAL | Status: AC
  Administered 2013-08-25: 300 mg via ORAL
  Filled 2013-08-25: qty 1

## 2013-08-25 MED ORDER — CITRIC ACID-SODIUM CITRATE 334-500 MG/5ML PO SOLN: 30.0000 mL | ORAL | Status: DC | PRN

## 2013-08-25 MED ORDER — OXYCODONE-ACETAMINOPHEN 5-325 MG PO TABS: 1.0000 | ORAL_TABLET | ORAL | Status: DC | PRN

## 2013-08-25 MED ORDER — TERBUTALINE SULFATE 1 MG/ML IJ SOLN: 0.2500 mg | Freq: Once | INTRAMUSCULAR | Status: AC | PRN

## 2013-08-25 MED ORDER — FAMOTIDINE 20 MG PO TABS: 20.0000 mg | ORAL_TABLET | Freq: Every day | ORAL | Status: DC

## 2013-08-25 MED ORDER — ACETAMINOPHEN 325 MG PO TABS: 650.0000 mg | ORAL_TABLET | ORAL | Status: DC | PRN

## 2013-08-25 MED ORDER — IBUPROFEN 600 MG PO TABS: 600.0000 mg | ORAL_TABLET | Freq: Four times a day (QID) | ORAL | Status: DC | PRN

## 2013-08-25 MED ORDER — LACTATED RINGERS IV SOLN
INTRAVENOUS | Status: DC
  Administered 2013-08-25 – 2013-08-26 (×2): via INTRAVENOUS

## 2013-08-25 MED ORDER — OXYTOCIN BOLUS FROM INFUSION: 500.0000 mL | INTRAVENOUS | Status: DC

## 2013-08-25 MED ORDER — ONDANSETRON HCL 4 MG/2ML IJ SOLN: 4.0000 mg | Freq: Four times a day (QID) | INTRAMUSCULAR | Status: DC | PRN

## 2013-08-25 MED ORDER — OXYTOCIN 40 UNITS IN LACTATED RINGERS INFUSION - SIMPLE MED: 1.0000 m[IU]/min | INTRAVENOUS | Status: DC

## 2013-08-25 MED ORDER — URSODIOL 500 MG PO TABS
500.0000 mg | ORAL_TABLET | Freq: Two times a day (BID) | ORAL | Status: DC
  Filled 2013-08-25: qty 1

## 2013-08-25 MED ORDER — OXYTOCIN 40 UNITS IN LACTATED RINGERS INFUSION - SIMPLE MED
1.0000 m[IU]/min | INTRAVENOUS | Status: DC
  Administered 2013-08-25: 2 m[IU]/min via INTRAVENOUS

## 2013-08-25 MED ORDER — LIDOCAINE HCL (PF) 1 % IJ SOLN
30.0000 mL | INTRAMUSCULAR | Status: DC | PRN
  Filled 2013-08-25: qty 30

## 2013-08-25 MED ORDER — OXYTOCIN 40 UNITS IN LACTATED RINGERS INFUSION - SIMPLE MED
62.5000 mL/h | INTRAVENOUS | Status: DC
  Filled 2013-08-25: qty 1000

## 2013-08-25 MED ORDER — HYDROXYZINE HCL 25 MG PO TABS
25.0000 mg | ORAL_TABLET | Freq: Four times a day (QID) | ORAL | Status: DC | PRN
  Filled 2013-08-25: qty 1

## 2013-08-25 MED ORDER — URSODIOL 500 MG PO TABS
500.0000 mg | ORAL_TABLET | Freq: Two times a day (BID) | ORAL | Status: DC
  Filled 2013-08-25 (×2): qty 1

## 2013-08-25 NOTE — H&P (Signed)
Dana FrockMarisol Reed is a 23 y.o. female G2P1001 with IUP at 3150w0d presenting for IOL due to cholestasis. Pt states she has been having regular, every 6 minutes contractions, associated with none vaginal bleeding.  Membranes are intact, with active fetal movement.    [facility]  Prenatal History/Complications: Cholestasis of pregnancy  Past Medical History: Past Medical History  Diagnosis Date  . Hx of migraines     duration = 1 year  . No pertinent past medical history   . Cholestasis of pregnancy in third trimester 07/22/2013    Testing at 32 weeks. Delivery at 37 weeks, meds   . Headache(784.0)     Migraines    Past Surgical History: Past Surgical History  Procedure Laterality Date  . Laparoscopic ovarian cystectomy  2007    Obstetrical History: OB History   Grav Para Term Preterm Abortions TAB SAB Ect Mult Living   2 1 1  0 0 0 0 0 0 1     Social History: History   Social History  . Marital Status: Single    Spouse Name: N/A    Number of Children: N/A  . Years of Education: N/A   Social History Main Topics  . Smoking status: Never Smoker   . Smokeless tobacco: Never Used  . Alcohol Use: No     Comment: "I've only had a drink once ever."  . Drug Use: No  . Sexual Activity: Not Currently    Partners: Male    Birth Control/ Protection: None   Other Topics Concern  . None   Social History Narrative  . None    Family History: Family History  Problem Relation Age of Onset  . Alcohol abuse Neg Hx   . Arthritis Neg Hx   . Asthma Neg Hx   . Birth defects Neg Hx   . Cancer Neg Hx   . COPD Neg Hx   . Depression Neg Hx   . Drug abuse Neg Hx   . Diabetes Neg Hx   . Early death Neg Hx   . Hearing loss Neg Hx   . Heart disease Neg Hx   . Hyperlipidemia Neg Hx   . Hypertension Neg Hx   . Kidney disease Neg Hx   . Learning disabilities Neg Hx   . Mental illness Neg Hx   . Mental retardation Neg Hx   . Miscarriages / Stillbirths Neg Hx    . Stroke Neg Hx   . Vision loss Neg Hx     Allergies: No Known Allergies  Prescriptions prior to admission  Medication Sig Dispense Refill  . calcium carbonate (TUMS - DOSED IN MG ELEMENTAL CALCIUM) 500 MG chewable tablet Chew 2 tablets by mouth as needed for indigestion or heartburn.      . hydrOXYzine (ATARAX/VISTARIL) 25 MG tablet Take 25 mg by mouth every 6 (six) hours as needed for itching.      Marland Kitchen. omeprazole (PRILOSEC) 40 MG capsule Take 1 capsule (40 mg total) by mouth daily.  30 capsule  0  . Prenatal Vit-Min-FA-Fish Oil ([facility] PRENATAL GUMMY PO) Take 2 tablets by mouth daily.      . ranitidine (ZANTAC) 150 MG tablet Take 1 tablet (150 mg total) by mouth 2 (two) times daily.  60 tablet  0  . ursodiol (ACTIGALL) 500 MG tablet Take 500 mg by mouth 2 (two) times daily.        Review of Systems: Negative unless otherwise stated in History above  Physicial Blood pressure 107/66, pulse 86, temperature 98 F (36.7 C), temperature source Oral, resp. rate 20, height 5\' 4"  (1.626 m), weight 78.019 kg (172 lb), last menstrual period 12/09/2012. General appearance: alert, cooperative and no distress Lungs: clear to auscultation bilaterally Heart: regular rate and rhythm Abdomen: soft, non-tender; bowel sounds normal Extremities: Homans sign is negative, no sign of DVT DTR's 2+ Presentation: cephalic Fetal monitoringBaseline: 145 bpm, Variability: Good {> 6 bpm), Accelerations: Reactive and Decelerations: Absent Uterine activityFrequency: Every 6 minutes Dilation: 3 Effacement (%): 50 Station: -1 Exam by:: Dr. Gayla Doss  Prenatal labs: ABO, Rh: O/Positive/-- (09/11 0000) Antibody: Negative (09/11 0000) Rubella:   RPR: Nonreactive, Nonreactive (09/11 0000)  HBsAg: Negative (09/11 0000)  HIV: Non-reactive, Non-reactive, Non-reactive (09/11 0000)  GBS: Negative (03/05 0000)  GTT: wnl  Prenatal Transfer Tool  Maternal Diabetes: No Genetic Screening: Declined Maternal  Ultrasounds/Referrals: Normal Fetal Ultrasounds or other Referrals:  None Maternal Substance Abuse:  No Significant Maternal Medications:  Meds include: Zantac Other: Actigall, Atarax Significant Maternal Lab Results: Lab values include: Group B Strep negative  No results found for this or any previous visit (from the past 24 hour(s)).  Assessment: Dana Reed is a 23 y.o. G2P1001 at [redacted]w[redacted]d by here for IOL due to cholestasis.  #Labor: Favorable cervix will start PIT #Pain: Labor support w/o meds currently, but epidural on request #FWB: Cat 1 #ID:  GBS neg #Feeding: breast/bottle  #MOC: IUD Mirena  #Circ:  Boy - NO circ  Wenda Low MD [facility] PGY-1 08/25/2013, 9:38 PM  I have seen and examined this patient and I agree with the above. SHAW, KIMBERLY 1:32 AM 08/26/2013

## 2013-08-25 NOTE — Progress Notes (Signed)
P = 91 

## 2013-08-26 ENCOUNTER — Encounter (HOSPITAL_COMMUNITY): Payer: Medicaid Other | Admitting: Anesthesiology

## 2013-08-26 ENCOUNTER — Encounter (HOSPITAL_COMMUNITY): Payer: Self-pay

## 2013-08-26 ENCOUNTER — Inpatient Hospital Stay (HOSPITAL_COMMUNITY): Payer: Medicaid Other | Admitting: Anesthesiology

## 2013-08-26 DIAGNOSIS — K838 Other specified diseases of biliary tract: Secondary | ICD-10-CM

## 2013-08-26 DIAGNOSIS — O26619 Liver and biliary tract disorders in pregnancy, unspecified trimester: Secondary | ICD-10-CM

## 2013-08-26 DIAGNOSIS — O9902 Anemia complicating childbirth: Secondary | ICD-10-CM

## 2013-08-26 DIAGNOSIS — D649 Anemia, unspecified: Secondary | ICD-10-CM

## 2013-08-26 LAB — RPR: RPR: NONREACTIVE

## 2013-08-26 LAB — ABO/RH: ABO/RH(D): O POS

## 2013-08-26 MED ORDER — PHENYLEPHRINE 40 MCG/ML (10ML) SYRINGE FOR IV PUSH (FOR BLOOD PRESSURE SUPPORT)
80.0000 ug | PREFILLED_SYRINGE | INTRAVENOUS | Status: DC | PRN
  Filled 2013-08-26: qty 10
  Filled 2013-08-26: qty 2

## 2013-08-26 MED ORDER — OXYCODONE-ACETAMINOPHEN 5-325 MG PO TABS
1.0000 | ORAL_TABLET | ORAL | Status: DC | PRN
  Administered 2013-08-26: 1 via ORAL
  Filled 2013-08-26: qty 1

## 2013-08-26 MED ORDER — DIPHENHYDRAMINE HCL 50 MG/ML IJ SOLN: 12.5000 mg | INTRAMUSCULAR | Status: DC | PRN

## 2013-08-26 MED ORDER — FENTANYL 2.5 MCG/ML BUPIVACAINE 1/10 % EPIDURAL INFUSION (WH - ANES)
INTRAMUSCULAR | Status: DC | PRN
  Administered 2013-08-26: 14 mL/h via EPIDURAL

## 2013-08-26 MED ORDER — EPHEDRINE 5 MG/ML INJ
10.0000 mg | INTRAVENOUS | Status: DC | PRN
  Filled 2013-08-26: qty 2
  Filled 2013-08-26: qty 4

## 2013-08-26 MED ORDER — ONDANSETRON HCL 4 MG/2ML IJ SOLN: 4.0000 mg | INTRAMUSCULAR | Status: DC | PRN

## 2013-08-26 MED ORDER — LACTATED RINGERS IV SOLN: 500.0000 mL | Freq: Once | INTRAVENOUS | Status: DC

## 2013-08-26 MED ORDER — TETANUS-DIPHTH-ACELL PERTUSSIS 5-2.5-18.5 LF-MCG/0.5 IM SUSP: 0.5000 mL | Freq: Once | INTRAMUSCULAR | Status: DC

## 2013-08-26 MED ORDER — ONDANSETRON HCL 4 MG PO TABS: 4.0000 mg | ORAL_TABLET | ORAL | Status: DC | PRN

## 2013-08-26 MED ORDER — BENZOCAINE-MENTHOL 20-0.5 % EX AERO: 1.0000 "application " | INHALATION_SPRAY | CUTANEOUS | Status: DC | PRN

## 2013-08-26 MED ORDER — SENNOSIDES-DOCUSATE SODIUM 8.6-50 MG PO TABS
2.0000 | ORAL_TABLET | ORAL | Status: DC
  Administered 2013-08-26: 2 via ORAL
  Filled 2013-08-26: qty 2

## 2013-08-26 MED ORDER — LIDOCAINE HCL (PF) 1 % IJ SOLN
INTRAMUSCULAR | Status: DC | PRN
  Administered 2013-08-26 (×2): 4 mL

## 2013-08-26 MED ORDER — SIMETHICONE 80 MG PO CHEW: 80.0000 mg | CHEWABLE_TABLET | ORAL | Status: DC | PRN

## 2013-08-26 MED ORDER — FENTANYL CITRATE 0.05 MG/ML IJ SOLN: 100.0000 ug | INTRAMUSCULAR | Status: DC | PRN

## 2013-08-26 MED ORDER — IBUPROFEN 600 MG PO TABS
600.0000 mg | ORAL_TABLET | Freq: Four times a day (QID) | ORAL | Status: DC
  Administered 2013-08-26 – 2013-08-27 (×5): 600 mg via ORAL
  Filled 2013-08-26 (×5): qty 1

## 2013-08-26 MED ORDER — LANOLIN HYDROUS EX OINT: TOPICAL_OINTMENT | CUTANEOUS | Status: DC | PRN

## 2013-08-26 MED ORDER — ZOLPIDEM TARTRATE 5 MG PO TABS: 5.0000 mg | ORAL_TABLET | Freq: Every evening | ORAL | Status: DC | PRN

## 2013-08-26 MED ORDER — PRENATAL MULTIVITAMIN CH
1.0000 | ORAL_TABLET | Freq: Every day | ORAL | Status: DC
  Administered 2013-08-26 – 2013-08-27 (×2): 1 via ORAL
  Filled 2013-08-26 (×2): qty 1

## 2013-08-26 MED ORDER — EPHEDRINE 5 MG/ML INJ
10.0000 mg | INTRAVENOUS | Status: DC | PRN
  Filled 2013-08-26: qty 2

## 2013-08-26 MED ORDER — DIPHENHYDRAMINE HCL 25 MG PO CAPS: 25.0000 mg | ORAL_CAPSULE | Freq: Four times a day (QID) | ORAL | Status: DC | PRN

## 2013-08-26 MED ORDER — PHENYLEPHRINE 40 MCG/ML (10ML) SYRINGE FOR IV PUSH (FOR BLOOD PRESSURE SUPPORT)
80.0000 ug | PREFILLED_SYRINGE | INTRAVENOUS | Status: DC | PRN
  Filled 2013-08-26: qty 2

## 2013-08-26 MED ORDER — WITCH HAZEL-GLYCERIN EX PADS: 1.0000 "application " | MEDICATED_PAD | CUTANEOUS | Status: DC | PRN

## 2013-08-26 MED ORDER — FENTANYL 2.5 MCG/ML BUPIVACAINE 1/10 % EPIDURAL INFUSION (WH - ANES)
14.0000 mL/h | INTRAMUSCULAR | Status: DC | PRN
  Filled 2013-08-26: qty 125

## 2013-08-26 MED ORDER — DIBUCAINE 1 % RE OINT: 1.0000 "application " | TOPICAL_OINTMENT | RECTAL | Status: DC | PRN

## 2013-08-26 NOTE — Progress Notes (Signed)
UR chart review completed.  

## 2013-08-26 NOTE — Anesthesia Postprocedure Evaluation (Signed)
Anesthesia Post Note  Patient: Dana Reed  Procedure(s) Performed: * No procedures listed *  Anesthesia type: Epidural  Patient location: Mother/Baby  Post pain: Pain level controlled  Post assessment: Post-op Vital signs reviewed  Last Vitals:  Filed Vitals:   08/26/13 1425  BP: 104/69  Pulse: 75  Temp: 36.7 C  Resp: 18    Post vital signs: Reviewed  Level of consciousness:alert  Complications: No apparent anesthesia complications Anesthesia Post-op Note  Patient: Dana Reed  Procedure(s) Performed: * No procedures listed *  Patient Location: PACU and Mother/Baby  Anesthesia Type:Epidural  Level of Consciousness: awake, alert , oriented and patient cooperative  Airway and Oxygen Therapy: Patient Spontanous Breathing  Post-op Pain: none  Post-op Assessment: Post-op Vital signs reviewed, No headache, No backache, No residual numbness and No residual motor weakness  Post-op Vital Signs: Reviewed and stable  Complications: No apparent anesthesia complications

## 2013-08-26 NOTE — Anesthesia Preprocedure Evaluation (Signed)
Anesthesia Evaluation  Patient identified by MRN, date of birth, ID band Patient awake    Reviewed: Allergy & Precautions, H&P , Patient's Chart, lab work & pertinent test results  Airway Mallampati: III TM Distance: >3 FB Neck ROM: Full    Dental no notable dental hx. (+) Teeth Intact   Pulmonary neg pulmonary ROS,  breath sounds clear to auscultation  Pulmonary exam normal       Cardiovascular negative cardio ROS  Rhythm:Regular Rate:Normal     Neuro/Psych  Headaches, negative psych ROS   GI/Hepatic GERD-  Medicated and Controlled,Cholestasis of pregnancy   Endo/Other  negative endocrine ROS  Renal/GU negative Renal ROS  negative genitourinary   Musculoskeletal negative musculoskeletal ROS (+)   Abdominal (+) + obese,   Peds  Hematology  (+) anemia ,   Anesthesia Other Findings   Reproductive/Obstetrics (+) Pregnancy                           Anesthesia Physical Anesthesia Plan  ASA: II  Anesthesia Plan: Epidural   Post-op Pain Management:    Induction:   Airway Management Planned: Natural Airway  Additional Equipment:   Intra-op Plan:   Post-operative Plan:   Informed Consent: I have reviewed the patients History and Physical, chart, labs and discussed the procedure including the risks, benefits and alternatives for the proposed anesthesia with the patient or authorized representative who has indicated his/her understanding and acceptance.     Plan Discussed with: Anesthesiologist  Anesthesia Plan Comments:         Anesthesia Quick Evaluation

## 2013-08-26 NOTE — Progress Notes (Signed)
Interpreter @ bedside per RN request to discuss pain management options.  Pt requesting epidural, CNM states pt may have epidural but prefers if she tries IV pains meds first. Pt prefers epidural, doesn't wanr IV pain meds.  Pt to be prepped for epidural.

## 2013-08-26 NOTE — Progress Notes (Signed)
Pt instructed she may begin pushing with ctxs. 

## 2013-08-26 NOTE — Anesthesia Procedure Notes (Signed)
Epidural Patient location during procedure: OB Start time: 08/26/2013 5:16 AM  Staffing Anesthesiologist: Girlie Veltri A. Performed by: anesthesiologist   Preanesthetic Checklist Completed: patient identified, site marked, surgical consent, pre-op evaluation, timeout performed, IV checked, risks and benefits discussed and monitors and equipment checked  Epidural Patient position: sitting Prep: site prepped and draped and DuraPrep Patient monitoring: continuous pulse ox and blood pressure Approach: midline Location: L3-L4 Injection technique: LOR air  Needle:  Needle type: Tuohy  Needle gauge: 17 G Needle length: 9 cm and 9 Needle insertion depth: 6 cm Catheter type: closed end flexible Catheter size: 19 Gauge Catheter at skin depth: 11 cm Test dose: negative and Other  Assessment Events: blood not aspirated, injection not painful, no injection resistance, negative IV test and no paresthesia  Additional Notes Patient identified. Risks and benefits discussed including failed block, incomplete  Pain control, post dural puncture headache, nerve damage, paralysis, blood pressure Changes, nausea, vomiting, reactions to medications-both toxic and allergic and post Partum back pain. All questions were answered. Patient expressed understanding and wished to proceed. Sterile technique was used throughout procedure. Epidural site was Dressed with sterile barrier dressing. No paresthesias, signs of intravascular injection Or signs of intrathecal spread were encountered.  Patient was more comfortable after the epidural was dosed. Please see RN's note for documentation of vital signs and FHR which are stable.

## 2013-08-27 MED ORDER — IBUPROFEN 600 MG PO TABS: 600.0000 mg | ORAL_TABLET | Freq: Four times a day (QID) | ORAL | Status: DC

## 2013-08-27 NOTE — Discharge Summary (Signed)
Obstetric Discharge Summary Reason for Admission: induction of labor  Prenatal Procedures: ultrasound Intrapartum Procedures: spontaneous vaginal delivery Postpartum Procedures: none Complications-Operative and Postpartum: none Hemoglobin  Date Value Ref Range Status  08/25/2013 10.3* 12.0 - 15.0 g/dL Final  1/47/82959/04/2013 62.111.4   Final  02/25/2013 12.9   Final     HCT  Date Value Ref Range Status  08/25/2013 31.2* 36.0 - 46.0 % Final  02/25/2013 34   Final  02/25/2013 39   Final   Delivery Note At 6:43 AM a healthy female was delivered via Vaginal, Spontaneous Delivery (Presentation:).  APGAR: 9, 9; weight 6 lb 12.1 oz (3065 g).   Placenta status: Intact, Spontaneous.  Cord: 3 vessels with the following complications: None.    Anesthesia: Epidural  Episiotomy: None Lacerations: None Suture Repair: na Est. Blood Loss (mL): 250  Pt pushed with good maternal effort to deliver a healthy boy. Baby delivered without difficulty, cleaned and place on maternal abdomen. Cord clamping performed and cut by FOB. Placenta delivered intact with 3V cord. Vagina and perineum inspected and without laceration.  Mom to postpartum and baby to skin to skin.   Mom to postpartum.  Baby to Couplet care / Skin to Skin.  Dana Reed, Dana Reed 08/26/2013, 8:54 AM  Physical Exam:  General: alert, cooperative and no distress Lochia: appropriate Uterine Fundus: firm Incision: NA DVT Evaluation: No evidence of DVT seen on physical exam. Negative Homan's sign. No cords or calf tenderness.  Discharge Diagnoses: IOL for cholestasis with SVD  Discharge Information: Date: 08/27/2013 Activity: pelvic rest Diet: routine Medications: Ibuprofen Condition: stable Instructions: refer to practice specific booklet Discharge to: home Follow-up Information   Follow up with [facility] In 4 weeks. (Discuss Mirena (contraception))      Brief Hospital Course: Dana Reed is a [id number] who underwent  IOL for  cholestasis resulting in SVD of viable baby boy without complications.  For further details, please refer to the delivery/operative note. Uncomplicated postpartum course. At time of discharge, pain was controlled on oral pain medications; she had appropriate lochia and was ambulating, voiding without difficulty, tolerating regular diet and passing flatus. She was deemed stable for discharge to home.  Newborn Data: Live born female  Birth Weight: 6 lb 12.1 oz (3065 g) APGAR: 9, 9  Home with mother.  Dana Reed, Dana Reed 08/27/2013, 7:56 AM  I have seen and examined this patient and agree with above documentation in the PA student's note.   Dana Reed, M.D. [facility] 08/27/2013 11:42 AM

## 2013-08-27 NOTE — H&P (Signed)
`````  Attestation of Attending Supervision of Advanced Practitioner: Evaluation and management procedures were performed by the PA/NP/CNM/OB Fellow under my supervision/collaboration. Chart reviewed and agree with management and plan.  Tilda BurrowFERGUSON,Naila Elizondo V 08/27/2013 6:51 PM

## 2013-08-27 NOTE — Lactation Note (Signed)
This note was copied from the chart of Dana Reed. Lactation Consultation Note Follow up consult:  Kennyth Loseacifica Interpreter [phone], Baby Dana 7828 hours old and baby sleeping in mother's arms.  Baby breastfed at 0900.  Mother understand volume guidelines, denies soreness and has no questions.  Mother will call LC to view next feeding.  Reviewed supply and demand and engorgement care.   Patient Name: Dana Reed [id number]'s Date: 08/27/2013 Reason for consult: Follow-up assessment   Maternal Data Has patient been taught Hand Expression?: Yes  Feeding    LATCH Score/Interventions                      Lactation Tools Discussed/Used     Consult Status Consult Status: PRN    Hardie PulleyBerkelhammer, Ezeriah Luty Boschen 08/27/2013, 11:29 AM

## 2013-08-27 NOTE — Discharge Instructions (Signed)

## 2013-09-12 NOTE — Progress Notes (Signed)
NST reactive.

## 2013-10-06 ENCOUNTER — Encounter: Payer: Self-pay | Admitting: Obstetrics & Gynecology

## 2013-10-06 ENCOUNTER — Ambulatory Visit (INDEPENDENT_AMBULATORY_CARE_PROVIDER_SITE_OTHER): Payer: Self-pay | Admitting: Obstetrics & Gynecology

## 2013-10-06 VITALS — BP 104/61 | HR 94 | Temp 98.4°F | Ht 64.0 in | Wt 158.3 lb

## 2013-10-06 DIAGNOSIS — Z3009 Encounter for other general counseling and advice on contraception: Secondary | ICD-10-CM

## 2013-10-06 MED ORDER — NORETHINDRONE 0.35 MG PO TABS: 1.0000 | ORAL_TABLET | Freq: Every day | ORAL | Status: DC

## 2013-10-06 NOTE — Progress Notes (Signed)
Patient ID: Dana Reed, female   DOB: [birth year], 23 y.o.   MRN: [id number] Subjective:     Dana Reed is a 23 y.o. female who presents for a postpartum visit. She is 6 weeks postpartum following a spontaneous vaginal delivery. I have fully reviewed the prenatal and intrapartum course. The delivery was at 37 gestational weeks. Outcome: spontaneous vaginal delivery. Anesthesia: epidural. Postpartum course has been uncomplicated. Baby's course has been unremarkable. Baby is feeding by breast. Bleeding no bleeding. Bowel function is normal. Bladder function is normal. Patient is not sexually active. Contraception method is abstinence. Postpartum depression screening: negative.  The following portions of the patient's history were reviewed and updated as appropriate: allergies, current medications, past family history, past medical history, past social history, past surgical history and problem list.  Review of Systems A comprehensive review of systems was negative.   Objective:    BP 104/61  Pulse 94  Temp(Src) 98.4 F (36.9 C) (Oral)  Ht 5\' 4"  (1.626 m)  Wt 158 lb 4.8 oz (71.804 kg)  BMI 27.16 kg/m2  Breastfeeding? Yes  General:  alert and no distress                                      Assessment:     6weeks postpartum exam. Pap smear not done at today's visit.   Plan:    1. Contraception: oral progesterone-only contraceptive 2. Micronor 1 po q day  3. Follow up in: 4 weeks for IUD placement.  $. OTC Flonase or Nasonex for seasonal allergies

## 2013-10-06 NOTE — Patient Instructions (Signed)
Levonorgestrel intrauterine device (IUD) Qu es este medicamento? El LEVONORGESTREL (DIU) es un dispositivo anticonceptivo (control de natalidad). El dispositivo se coloca dentro del tero por un profesional de la salud. Se utiliza para evitar el embarazo y tambin se puede utilizar para tratar el sangrado abundante que ocurre durante su perodo. Dependiendo del dispositivo, se puede utilizar por 3 a 5 aos. Este medicamento puede ser utilizado para otros usos; si tiene alguna pregunta consulte con su proveedor de atencin mdica o con su farmacutico. MARCAS COMERCIALES DISPONIBLES: Mirena, Skyla Qu le debo informar a mi profesional de la salud antes de tomar este medicamento? Necesita saber si usted presenta alguno de los siguientes problemas o situaciones: -exmen de Papanicolaou anormal -cncer de mama, cuello del tero o tero -diabetes -endometritis -si tiene una infeccin plvica o genital actual o en el pasado -tiene ms de una pareja sexual o si su pareja tiene ms de una pareja -enfermedad cardiaca -antecedente de embarazo tubrico o ectpico -problemas del sistema inmunolgico -DIU colocado -enfermedad heptica o tumor del hgado -problemas con la coagulacin o si toma diluyentes sanguneos -usa medicamentos intravenoso -forma inusual del tero -sangrado vaginal que no tiene explicacin -una reaccin alrgica o inusual al levonorgestrel, a otras hormonas, a la silicona o polietilenos, a otros medicamentos, alimentos, colorantes o conservantes -si est embarazada o buscando quedar embarazada -si est amamantando a un beb Cmo debo utilizar este medicamento? Un profesional de la salud coloca este dispositivo en el tero. Hable con su pediatra para informarse acerca del uso de este medicamento en nios. Puede requerir atencin especial. Sobredosis: Pngase en contacto inmediatamente con un centro toxicolgico o una sala de urgencia si usted cree que haya tomado demasiado  medicamento. ATENCIN: Este medicamento es solo para usted. No comparta este medicamento con nadie. Qu sucede si me olvido de una dosis? No se aplica en este caso. Qu puede interactuar con este medicamento? No tome esta medicina con ninguno de los siguientes medicamentos: -amprenavir -bosentano -fosamprenavir Esta medicina tambin puede interactuar con los siguientes medicamentos: -aprepitant -barbitricos para producir el sueo o para el tratamiento de convulsiones -bexaroteno -griseofulvina -medicamentos para tratar los convulsiones, tales como carbamazepina, etotona, felbamato, oxcarbazepina, fenitona, topiramato -modafinilo -pioglitazona -rifabutina -rifampicina -rifapentina -algunos medicamentos para tratar el virus VIH, tales como atazanavir, indinavir, lopinavir, nelfinavir, tipranavir, ritonavir -hierba de [place] -warfarina Puede ser que esta lista no menciona todas las posibles interacciones. Informe a su profesional de la salud de todos los productos a base de hierbas, medicamentos de venta libre o suplementos nutritivos que est tomando. Si usted fuma, consume bebidas alcohlicas o si utiliza drogas ilegales, indqueselo tambin a su profesional de la salud. Algunas sustancias pueden interactuar con su medicamento. A qu debo estar atento al usar este medicamento? Visite a su mdico o a su profesional de la salud para chequear su evolucin peridicamente. Visite a su mdico si usted o su pareja tiene relaciones sexuales con otras personas, se vuelve VIH positivo o contrae una enfermedad de transmisin sexual. Este medicamento no la protege de la infeccin por VIH (SIDA) ni de ninguna otra enfermedad de transmisin sexual. Puede controlar la ubicacin del DIU usted misma palpando con sus dedos limpios los hilos en la parte anterior de la vagina. No tire de los hilos. Es un buen hbito controlar la ubicacin del dispositivo despus de cada perodo menstrual. Si no slo  siente los hilos sino que adems siente otra parte ms del DIU o si no puede sentir los hilos, consulte a su mdico   inmediatamente. El DIU puede salirse por s solo. Puede quedar embarazada si el dispositivo se sale de su lugar. Utilice un mtodo anticonceptivo adicional, como preservativos, y consulte a su proveedor de atencin mdica s observa que el DIU se sali de su lugar. La utilizacin de tampones no cambia la posicin del DIU y no hay inconvenientes en usarlos durante su perodo. Qu efectos secundarios puedo tener al utilizar este medicamento? Efectos secundarios que debe informar a su mdico o a su profesional de la salud tan pronto como sea posible: -reacciones alrgicas como erupcin cutnea, picazn o urticarias, hinchazn de la cara, labios o lengua -fiebre, sntomas gripales -llagas genitales -alta presin sangunea -ausencia de un perodo menstrual durante 6 semanas mientras lo utiliza -dolor, hinchazn o calor en las piernas -dolor o sensibilidad del plvico -dolor de cabeza repentino o severo -signos de embarazo -calambres estomacales -falta de aliento repentina -problemas de coordinacin, del habla, al caminar -sangrado, flujo vaginal inusual -color amarillento de los ojos o la piel Efectos secundarios que, por lo general, no requieren atencin mdica (debe informarlos a su mdico o a su profesional de la salud si persisten o si son molestos): -acn -dolor de pecho -cambios en el deseo sexual o capacidad -cambios de peso -calambres, mareos o sensacin de desmayo mientras se introduce el dispositivo -dolor de cabeza -sangrado menstruales irregulares en los primeros 3 a 6 meses de usar -nuseas Puede ser que esta lista no menciona todos los posibles efectos secundarios. Comunquese a su mdico por asesoramiento mdico sobre los efectos secundarios. Usted puede informar los efectos secundarios a la FDA por telfono al 1-800-FDA-1088. Dnde debo guardar mi medicina? No  se aplica en este caso. ATENCIN: Este folleto es un resumen. Puede ser que no cubra toda la posible informacin. Si usted tiene preguntas acerca de esta medicina, consulte con su mdico, su farmacutico o su profesional de la salud.  2014, Elsevier/[employer] Standard. (2011-07-23 16:57:41)  

## 2013-12-02 ENCOUNTER — Encounter (INDEPENDENT_AMBULATORY_CARE_PROVIDER_SITE_OTHER): Payer: Self-pay | Admitting: Obstetrics & Gynecology

## 2013-12-02 NOTE — Progress Notes (Signed)
Patient not seen today. IUD was not available. We refaxed her application and received confirmation. Patient paid for insertion today confirmed with front desk that her payment for today will carry forward to the next visit.

## 2013-12-06 ENCOUNTER — Encounter: Payer: Self-pay | Admitting: Obstetrics & Gynecology

## 2013-12-06 NOTE — Progress Notes (Signed)
Patient ID: Dana Reed, female   DOB: 08/21/1990, 23 y.o.   MRN: [id number] Pt not seen.  Still waiting for IUD to come in

## 2013-12-06 NOTE — Patient Instructions (Signed)
Pt not seen.

## 2014-03-25 ENCOUNTER — Encounter (HOSPITAL_COMMUNITY): Payer: Self-pay

## 2014-03-25 ENCOUNTER — Inpatient Hospital Stay (HOSPITAL_COMMUNITY)
Admission: AD | Admit: 2014-03-25 | Discharge: 2014-03-25 | Disposition: A | Discharge status: Home / Self Care | Payer: Medicaid Other | Source: Ambulatory Visit | Attending: Obstetrics & Gynecology | Admitting: Obstetrics & Gynecology

## 2014-03-25 DIAGNOSIS — Z30431 Encounter for routine checking of intrauterine contraceptive device: Secondary | ICD-10-CM | POA: Insufficient documentation

## 2014-03-25 DIAGNOSIS — N926 Irregular menstruation, unspecified: Secondary | ICD-10-CM | POA: Insufficient documentation

## 2014-03-25 DIAGNOSIS — N925 Other specified irregular menstruation: Secondary | ICD-10-CM

## 2014-03-25 LAB — URINALYSIS, ROUTINE W REFLEX MICROSCOPIC
Bilirubin Urine: NEGATIVE
Glucose, UA: NEGATIVE mg/dL
Ketones, ur: NEGATIVE mg/dL
Leukocytes, UA: NEGATIVE
Nitrite: NEGATIVE
Protein, ur: NEGATIVE mg/dL
SPECIFIC GRAVITY, URINE: 1.01 (ref 1.005–1.030)
Urobilinogen, UA: 0.2 mg/dL (ref 0.0–1.0)
pH: 6 (ref 5.0–8.0)

## 2014-03-25 LAB — URINE MICROSCOPIC-ADD ON

## 2014-03-25 LAB — POCT PREGNANCY, URINE: PREG TEST UR: NEGATIVE

## 2014-03-25 NOTE — MAU Provider Note (Signed)
History     CSN: [id number]  Arrival date and time: 03/25/14 [id number]   First Provider Initiated Contact with Patient 03/25/14 2104      Chief Complaint  Patient presents with  . Contraception   HPI [name] is a 23 y.o. [id number] who presents to MAU today with concern about her Mirena IUD. [facility]. She states irregular bleeding with IUD and mild cramping off and on. She states that this morning she noted heavier bleeding, which has subsided to light spotting now. She attempted to feel the strings and could not. She states that she called [facility] and was told to come here to have her IUD checked. She denies other GYN concerns.   OB History   Grav Para Term Preterm Abortions TAB SAB Ect Mult Living   2 2 2  0 0 0 0 0 0 2      Past Medical History  Diagnosis Date  . Hx of migraines     duration = 1 year  . No pertinent past medical history   . Cholestasis of pregnancy in third trimester 07/22/2013    Testing at 32 weeks. Delivery at 37 weeks, meds   . Headache(784.0)     Migraines    Past Surgical History  Procedure Laterality Date  . Laparoscopic ovarian cystectomy  2007    Family History  Problem Relation Age of Onset  . Alcohol abuse Neg Hx   . Arthritis Neg Hx   . Asthma Neg Hx   . Birth defects Neg Hx   . Cancer Neg Hx   . COPD Neg Hx   . Depression Neg Hx   . Drug abuse Neg Hx   . Diabetes Neg Hx   . Early death Neg Hx   . Hearing loss Neg Hx   . Heart disease Neg Hx   . Hyperlipidemia Neg Hx   . Hypertension Neg Hx   . Kidney disease Neg Hx   . Learning disabilities Neg Hx   . Mental illness Neg Hx   . Mental retardation Neg Hx   . Miscarriages / Stillbirths Neg Hx   . Stroke Neg Hx   . Vision loss Neg Hx     History  Substance Use Topics  . Smoking status: Never Smoker   . Smokeless tobacco: Never Used  . Alcohol Use: No     Comment: "I've only had a drink once ever."    Allergies: No Known  Allergies  Prescriptions prior to admission  Medication Sig Dispense Refill  . ibuprofen (ADVIL,MOTRIN) 200 MG tablet Take 400 mg by mouth every 6 (six) hours as needed for mild pain or moderate pain.        ROS Physical Exam   Blood pressure 118/67, pulse 92, temperature 98 F (36.7 C), temperature source Oral, resp. rate 16, height 5\' 3"  (1.6 m), weight 155 lb 9.6 oz (70.58 kg), currently breastfeeding.  Physical Exam  Constitutional: She is oriented to person, place, and time. She appears well-developed and well-nourished. No distress.  HENT:  Head: Normocephalic.  Cardiovascular: Normal rate.   Respiratory: Effort normal.  GI: Soft. She exhibits no distension and no mass. There is tenderness (mild suprapubic tenderness to palpation). There is no rebound and no guarding.  Genitourinary: Uterus is tender (mild). Uterus is not enlarged. Cervix exhibits no motion tenderness, no discharge and no friability. Right adnexum displays no mass and no tenderness. Left adnexum displays no mass  and no tenderness. There is bleeding (scant light brown blood) around the vagina. Vaginal discharge: small amount of thin, white discharge noted.    Neurological: She is alert and oriented to person, place, and time.  Skin: Skin is warm and dry. No erythema.  Psychiatric: She has a normal mood and affect.   Results for orders placed during the hospital encounter of 03/25/14 (from the past 24 hour(s))  URINALYSIS, ROUTINE W REFLEX MICROSCOPIC     Status: Abnormal   Collection Time    03/25/14  7:54 PM      Result Value Ref Range   Color, Urine YELLOW  YELLOW   APPearance CLEAR  CLEAR   Specific Gravity, Urine 1.010  1.005 - 1.030   pH 6.0  5.0 - 8.0   Glucose, UA NEGATIVE  NEGATIVE mg/dL   Hgb urine dipstick MODERATE (*) NEGATIVE   Bilirubin Urine NEGATIVE  NEGATIVE   Ketones, ur NEGATIVE  NEGATIVE mg/dL   Protein, ur NEGATIVE  NEGATIVE mg/dL   Urobilinogen, UA 0.2  0.0 - 1.0 mg/dL   Nitrite  NEGATIVE  NEGATIVE   Leukocytes, UA NEGATIVE  NEGATIVE  URINE MICROSCOPIC-ADD ON     Status: None   Collection Time    03/25/14  7:54 PM      Result Value Ref Range   Squamous Epithelial / LPF RARE  RARE   WBC, UA 0-2  <3 WBC/hpf   RBC / HPF 0-2  <3 RBC/hpf   Bacteria, UA RARE  RARE  POCT PREGNANCY, URINE     Status: None   Collection Time    03/25/14  8:06 PM      Result Value Ref Range   Preg Test, Ur NEGATIVE  NEGATIVE    MAU Course  Procedures None  MDM UPT - negative UA today Assessment and Plan  A: IUD check Irregular bleeding with IUD  P: Discharge home Bleeding preacutrions discussed Recommended continue Ibuprofen PRN for pain Patient advised to followup with [facility] if unhappy with bleeding profile of Mirena Patient may return to MAU as needed or if her condition were to change or worsen  Marny LowensteinJulie N Hayde Kilgour, PA-C  03/25/2014, 9:10 PM

## 2014-03-25 NOTE — Discharge Instructions (Signed)
Levonorgestrel intrauterine device (IUD) Qu es este medicamento? El LEVONORGESTREL (DIU) es un dispositivo anticonceptivo (control de natalidad). El dispositivo se coloca dentro del tero por un profesional de la salud. Se utiliza para evitar el embarazo y tambin se puede utilizar para tratar el sangrado abundante que ocurre durante su perodo. Dependiendo del dispositivo, se puede utilizar por 3 a 5 aos. Este medicamento puede ser utilizado para otros usos; si tiene alguna pregunta consulte con su proveedor de atencin mdica o con su farmacutico. MARCAS COMERCIALES DISPONIBLES: LILETTA, Mirena, Skyla Qu le debo informar a mi profesional de la salud antes de tomar este medicamento? Necesita saber si usted presenta alguno de los siguientes problemas o situaciones: -exmen de Papanicolaou anormal -cncer de mama, cuello del tero o tero -diabetes -endometritis -si tiene una infeccin plvica o genital actual o en el pasado -tiene ms de una pareja sexual o si su pareja tiene ms de una pareja -enfermedad cardiaca -antecedente de embarazo tubrico o ectpico -problemas del sistema inmunolgico -DIU colocado -enfermedad heptica o tumor del hgado -problemas con la coagulacin o si toma diluyentes sanguneos -usa medicamentos intravenoso -forma inusual del tero -sangrado vaginal que no tiene explicacin -una reaccin alrgica o inusual al levonorgestrel, a otras hormonas, a la silicona o polietilenos, a otros medicamentos, alimentos, colorantes o conservantes -si est embarazada o buscando quedar embarazada -si est amamantando a un beb Cmo debo utilizar este medicamento? Un profesional de la salud coloca este dispositivo en el tero. Hable con su pediatra para informarse acerca del uso de este medicamento en nios. Puede requerir atencin especial. Sobredosis: Pngase en contacto inmediatamente con un centro toxicolgico o una sala de urgencia si usted cree que haya tomado  demasiado medicamento. ATENCIN: Este medicamento es solo para usted. No comparta este medicamento con nadie. Qu sucede si me olvido de una dosis? No se aplica en este caso. Qu puede interactuar con este medicamento? No tome esta medicina con ninguno de los siguientes medicamentos: -amprenavir -bosentano -fosamprenavir Esta medicina tambin puede interactuar con los siguientes medicamentos: -aprepitant -barbitricos para producir el sueo o para el tratamiento de convulsiones -bexaroteno -griseofulvina -medicamentos para tratar los convulsiones, tales como carbamazepina, etotona, felbamato, oxcarbazepina, fenitona, topiramato -modafinilo -pioglitazona -rifabutina -rifampicina -rifapentina -algunos medicamentos para tratar el virus VIH, tales como atazanavir, indinavir, lopinavir, nelfinavir, tipranavir, ritonavir -hierba de [place] -warfarina Puede ser que esta lista no menciona todas las posibles interacciones. Informe a su profesional de la salud de todos los productos a base de hierbas, medicamentos de venta libre o suplementos nutritivos que est tomando. Si usted fuma, consume bebidas alcohlicas o si utiliza drogas ilegales, indqueselo tambin a su profesional de la salud. Algunas sustancias pueden interactuar con su medicamento. A qu debo estar atento al usar este medicamento? Visite a su mdico o a su profesional de la salud para chequear su evolucin peridicamente. Visite a su mdico si usted o su pareja tiene relaciones sexuales con otras personas, se vuelve VIH positivo o contrae una enfermedad de transmisin sexual. Este medicamento no la protege de la infeccin por VIH (SIDA) ni de ninguna otra enfermedad de transmisin sexual. Puede controlar la ubicacin del DIU usted misma palpando con sus dedos limpios los hilos en la parte anterior de la vagina. No tire de los hilos. Es un buen hbito controlar la ubicacin del dispositivo despus de cada perodo menstrual. Si  no slo siente los hilos sino que adems siente otra parte ms del DIU o si no puede sentir los hilos, consulte a su   mdico inmediatamente. El DIU puede salirse por s solo. Puede quedar embarazada si el dispositivo se sale de su lugar. Utilice un mtodo anticonceptivo adicional, como preservativos, y consulte a su proveedor de atencin mdica s observa que el DIU se sali de su lugar. La utilizacin de tampones no cambia la posicin del DIU y no hay inconvenientes en usarlos durante su perodo. Qu efectos secundarios puedo tener al utilizar este medicamento? Efectos secundarios que debe informar a su mdico o a su profesional de la salud tan pronto como sea posible: -reacciones alrgicas como erupcin cutnea, picazn o urticarias, hinchazn de la cara, labios o lengua -fiebre, sntomas gripales -llagas genitales -alta presin sangunea -ausencia de un perodo menstrual durante 6 semanas mientras lo utiliza -dolor, hinchazn o calor en las piernas -dolor o sensibilidad del plvico -dolor de cabeza repentino o severo -signos de embarazo -calambres estomacales -falta de aliento repentina -problemas de coordinacin, del habla, al caminar -sangrado, flujo vaginal inusual -color amarillento de los ojos o la piel Efectos secundarios que, por lo general, no requieren atencin mdica (debe informarlos a su mdico o a su profesional de la salud si persisten o si son molestos): -acn -dolor de pecho -cambios en el deseo sexual o capacidad -cambios de peso -calambres, mareos o sensacin de desmayo mientras se introduce el dispositivo -dolor de cabeza -sangrado menstruales irregulares en los primeros 3 a 6 meses de usar -nuseas Puede ser que esta lista no menciona todos los posibles efectos secundarios. Comunquese a su mdico por asesoramiento mdico sobre los efectos secundarios. Usted puede informar los efectos secundarios a la FDA por telfono al 1-800-FDA-1088. Dnde debo guardar mi  medicina? No se aplica en este caso. ATENCIN: Este folleto es un resumen. Puede ser que no cubra toda la posible informacin. Si usted tiene preguntas acerca de esta medicina, consulte con su mdico, su farmacutico o su profesional de la salud.  2015, Elsevier/[employer]. (2011-07-23 16:57:41)  

## 2014-03-25 NOTE — MAU Note (Signed)
Pt states sent from health dept bc they placed a mirena 3 months ago but has not felt the strings. Is having a lot of abdominal for 3 days and having some vaginal bleeding that has been more than what she has experienced

## 2014-04-08 ENCOUNTER — Inpatient Hospital Stay (HOSPITAL_COMMUNITY)
Admission: AD | Admit: 2014-04-08 | Discharge: 2014-04-08 | Disposition: A | Discharge status: Home / Self Care | Payer: Medicaid Other | Source: Ambulatory Visit | Attending: Obstetrics and Gynecology | Admitting: Obstetrics and Gynecology

## 2014-04-08 DIAGNOSIS — T8383XA Hemorrhage of genitourinary prosthetic devices, implants and grafts, initial encounter: Secondary | ICD-10-CM | POA: Insufficient documentation

## 2014-04-08 DIAGNOSIS — N939 Abnormal uterine and vaginal bleeding, unspecified: Secondary | ICD-10-CM

## 2014-04-08 LAB — CBC
HCT: 33.9 % — ABNORMAL LOW (ref 36.0–46.0)
Hemoglobin: 11.4 g/dL — ABNORMAL LOW (ref 12.0–15.0)
MCH: 30 pg (ref 26.0–34.0)
MCHC: 33.6 g/dL (ref 30.0–36.0)
MCV: 89.2 fL (ref 78.0–100.0)
Platelets: 239 10*3/uL (ref 150–400)
RBC: 3.8 MIL/uL — ABNORMAL LOW (ref 3.87–5.11)
RDW: 13.4 % (ref 11.5–15.5)
WBC: 8.1 10*3/uL (ref 4.0–10.5)

## 2014-04-08 MED ORDER — FLUCONAZOLE 150 MG PO TABS: 150.0000 mg | ORAL_TABLET | Freq: Every day | ORAL | Status: DC

## 2014-04-08 MED ORDER — NORGESTIMATE-ETH ESTRADIOL 0.25-35 MG-MCG PO TABS: 1.0000 | ORAL_TABLET | Freq: Every day | ORAL | Status: DC

## 2014-04-08 NOTE — MAU Provider Note (Signed)
`````  Attestation of Attending Supervision of Advanced Practitioner: Evaluation and management procedures were performed by the PA/NP/CNM/OB Fellow under my supervision/collaboration. Chart reviewed and agree with management and plan.  Jamarien Rodkey V 04/08/2014 10:17 PM

## 2014-04-08 NOTE — MAU Provider Note (Signed)
History     CSN: [id number]  Arrival date and time: 04/08/14 2005   First Provider Initiated Contact with Patient 04/08/14 2020      No chief complaint on file.  HPI Dana Reed 23 y.o. Comes to MAU with vaginal bleeding and internal vaginal itching.  Had postpartum visit at clinic downstairs in April 2015.  [facility]  Has had frequent bleeding since it was placed. [facility] for [facility] and was seen in MAU on 03-25-14 as she was having bleeding and could not feel the strings.  Today she is still bleeding and today began having internal itching.  She also had some small clots or tissue which passed.  This is unusual for her and she is wondering if everything is OK.  She has never had this kind of bleeding before. Has used Ibuprofen 800mg  PO yesterday x 1 and today again at 6 pm.  She states she is not in much pain - 4/10.  She uses pads daily and is using the Always brand.  Interpreter present for interview.  OB History   Grav Para Term Preterm Abortions TAB SAB Ect Mult Living   2 2 2  0 0 0 0 0 0 2      Past Medical History  Diagnosis Date  . Hx of migraines     duration = 1 year  . No pertinent past medical history   . Cholestasis of pregnancy in third trimester 07/22/2013    Testing at 32 weeks. Delivery at 37 weeks, meds   . Headache(784.0)     Migraines    Past Surgical History  Procedure Laterality Date  . Laparoscopic ovarian cystectomy  2007    Family History  Problem Relation Age of Onset  . Alcohol abuse Neg Hx   . Arthritis Neg Hx   . Asthma Neg Hx   . Birth defects Neg Hx   . Cancer Neg Hx   . COPD Neg Hx   . Depression Neg Hx   . Drug abuse Neg Hx   . Diabetes Neg Hx   . Early death Neg Hx   . Hearing loss Neg Hx   . Heart disease Neg Hx   . Hyperlipidemia Neg Hx   . Hypertension Neg Hx   . Kidney disease Neg Hx   . Learning disabilities Neg Hx   . Mental illness Neg  Hx   . Mental retardation Neg Hx   . Miscarriages / Stillbirths Neg Hx   . Stroke Neg Hx   . Vision loss Neg Hx     History  Substance Use Topics  . Smoking status: Never Smoker   . Smokeless tobacco: Never Used  . Alcohol Use: No     Comment: "I've only had a drink once ever."    Allergies: No Known Allergies  Prescriptions prior to admission  Medication Sig Dispense Refill  . ibuprofen (ADVIL,MOTRIN) 200 MG tablet Take 400 mg by mouth every 6 (six) hours as needed for mild pain or moderate pain.        Review of Systems  Constitutional: Negative for fever.  Gastrointestinal: Negative for nausea and vomiting.  Genitourinary:       No vaginal discharge. vaginal bleeding. Vaginal itching. No dysuria.   Physical Exam   currently breastfeeding.  Physical Exam  Nursing note and vitals reviewed. Constitutional: She is oriented to person, place, and time. She appears well-developed and  well-nourished.  HENT:  Head: Normocephalic.  Eyes: EOM are normal.  Neck: Neck supple.  GI: Soft. There is no tenderness.  Genitourinary:  Speculum exam: Vulva - small amount of dried blood seen on labia majora Vagina - Small amount of bloody discharge, no odor, cleared vagina with one large swab Cervix - No contact bleeding.  No active bleeding from the os Bimanual exam: Cervix closed, IUD strings seen and approx 2 cm in length Uterus non tender, normal size Adnexa non tender, no masses bilaterally Chaperone present for exam.  Musculoskeletal: Normal range of motion.  Neurological: She is alert and oriented to person, place, and time.  Skin: Skin is warm and dry.  Psychiatric: She has a normal mood and affect.    MAU Course  Procedures Results for orders placed during the hospital encounter of 04/08/14 (from the past 24 hour(s))  CBC     Status: Abnormal   Collection Time    04/08/14  8:40 PM      Result Value Ref Range   WBC 8.1  4.0 - 10.5 K/uL   RBC 3.80 (*) 3.87 - 5.11  MIL/uL   Hemoglobin 11.4 (*) 12.0 - 15.0 g/dL   HCT 96.033.9 (*) 45.436.0 - 09.846.0 %   MCV 89.2  78.0 - 100.0 fL   MCH 30.0  26.0 - 34.0 pg   MCHC 33.6  30.0 - 36.0 g/dL   RDW 11.913.4  14.711.5 - 82.915.5 %   Platelets 239  150 - 400 K/uL    MDM Discussed at length the normal course of Mirena IUD and that the bleeding she is having is normal.  Reviewed her hemoglobing and that it is increased since her birth even with the almost constant bleeding she is having.  Will prescribe one pack of pills to help the bleeding stop - even though it is a small amount today.  She inquired about removing the IUD.  Instructed that would need to [facility] to manage her contraception but that it would not need to be removed prior to 6 months as her bleeding may be much less in 6 months.  Assessment and Plan  Bleeding with Mirena IUD  Plan Rx sprintec 1 pack of pills Rx Diflucan for itching. [facility].  BURLESON,TERRI 04/08/2014, 8:26 PM

## 2014-04-08 NOTE — Discharge Instructions (Signed)
Get your medicine from the pharmacy and begin today. Call the health department if you are continuing to have problems. You may have some irregular bleeding for 3 more months.

## 2014-04-08 NOTE — MAU Note (Signed)
WITH INTERPRETER-  MADAY  -    SAYS WHEN IUD PUT IN -IN July- SHE STARTED BLEEDING AND  HAS BLED EVERYDAY SINCE   EXCEPT LAST WEEK WAS LESS.      TONIGHT  CAME IN  BECAUSE X3 DAYS HAS BEEN HAVING PIECES COMING OUT - LIKE TISSUE.  OUTSIDE   ITCHES.   ABD  PAIN STARTED  YESTERDAY-   IBUPROFEN   800MG   AT 6PM-   TODAY      WITH SOME RELIEF .

## 2014-04-18 ENCOUNTER — Encounter (HOSPITAL_COMMUNITY): Payer: Self-pay

## 2014-05-19 ENCOUNTER — Encounter: Payer: Self-pay | Admitting: Obstetrics & Gynecology

## 2020-12-01 DIAGNOSIS — N898 Other specified noninflammatory disorders of vagina: Secondary | ICD-10-CM | POA: Insufficient documentation

## 2020-12-01 DIAGNOSIS — Z5321 Procedure and treatment not carried out due to patient leaving prior to being seen by health care provider: Secondary | ICD-10-CM | POA: Insufficient documentation

## 2020-12-02 ENCOUNTER — Encounter (HOSPITAL_COMMUNITY): Payer: Self-pay | Admitting: *Deleted

## 2020-12-02 ENCOUNTER — Emergency Department (HOSPITAL_COMMUNITY)
Admission: EM | Admit: 2020-12-02 | Discharge: 2020-12-02 | Discharge status: Against Medical Advice | Payer: Self-pay | Attending: Physician Assistant | Admitting: Physician Assistant

## 2020-12-02 ENCOUNTER — Other Ambulatory Visit: Payer: Self-pay

## 2020-12-02 LAB — URINALYSIS, ROUTINE W REFLEX MICROSCOPIC
Bilirubin Urine: NEGATIVE
Glucose, UA: NEGATIVE mg/dL
Ketones, ur: NEGATIVE mg/dL
Nitrite: NEGATIVE
Protein, ur: NEGATIVE mg/dL
Specific Gravity, Urine: 1.021 (ref 1.005–1.030)
pH: 6 (ref 5.0–8.0)

## 2020-12-02 LAB — PREGNANCY, URINE: Preg Test, Ur: NEGATIVE

## 2020-12-02 NOTE — ED Notes (Signed)
Pt did not want to stay and left . 

## 2020-12-02 NOTE — ED Provider Notes (Signed)
Emergency Medicine Provider Triage Evaluation Note  Dana Reed , a 30 y.o. female  was evaluated in triage.  Pt complains of vaginal discharge ongoing for the last week or so.  Reports associated itching.  Has seen her OB/GYN several times and has finished 2 courses of treatment but believes this is getting worse.  Last menstrual cycle November 19, 2020.  Patient also with lower abdominal pain  Review of Systems  Positive: Vaginal itching and discharge, lower abdominal pain Negative: Fever, chills  Physical Exam  BP 100/66 (BP Location: Left Arm)   Pulse 80   Temp 98.5 F (36.9 C) (Oral)   Resp 16   SpO2 100%  Gen:   Awake, no distress   Resp:  Normal effort  MSK:   Moves extremities without difficulty  Other:  Ambulatory without difficulty  Medical Decision Making  Medically screening exam initiated at 1:46 AM.  Appropriate orders placed.  Saryiah Tellez-Gayosso was informed that the remainder of the evaluation will be completed by another provider, this initial triage assessment does not replace that evaluation, and the importance of remaining in the ED until their evaluation is complete.  Abdominal pain and vaginal discharge   Iliyah Bui, Boyd Kerbs 12/02/20 0157    Nira Conn, MD 12/04/20 [phone]

## 2020-12-02 NOTE — ED Triage Notes (Signed)
The pt is being treated for a vaginal problem but it is not getting any better lmp June 5th

## 2021-01-22 ENCOUNTER — Encounter: Payer: Self-pay | Admitting: Nurse Practitioner

## 2021-03-14 ENCOUNTER — Encounter: Payer: Self-pay | Admitting: Nurse Practitioner

## 2021-03-14 ENCOUNTER — Other Ambulatory Visit (HOSPITAL_COMMUNITY)
Admission: RE | Admit: 2021-03-14 | Discharge: 2021-03-14 | Disposition: A | Discharge status: Home / Self Care | Payer: Self-pay | Source: Ambulatory Visit | Attending: Nurse Practitioner | Admitting: Nurse Practitioner

## 2021-03-14 ENCOUNTER — Other Ambulatory Visit: Payer: Self-pay

## 2021-03-14 ENCOUNTER — Ambulatory Visit (INDEPENDENT_AMBULATORY_CARE_PROVIDER_SITE_OTHER): Payer: Self-pay | Admitting: Nurse Practitioner

## 2021-03-14 VITALS — BP 122/76 | Ht 63.0 in | Wt 151.0 lb

## 2021-03-14 DIAGNOSIS — Z01419 Encounter for gynecological examination (general) (routine) without abnormal findings: Secondary | ICD-10-CM | POA: Insufficient documentation

## 2021-03-14 DIAGNOSIS — N898 Other specified noninflammatory disorders of vagina: Secondary | ICD-10-CM

## 2021-03-14 DIAGNOSIS — B373 Candidiasis of vulva and vagina: Secondary | ICD-10-CM

## 2021-03-14 DIAGNOSIS — B3731 Acute candidiasis of vulva and vagina: Secondary | ICD-10-CM

## 2021-03-14 DIAGNOSIS — Z3009 Encounter for other general counseling and advice on contraception: Secondary | ICD-10-CM

## 2021-03-14 DIAGNOSIS — R300 Dysuria: Secondary | ICD-10-CM

## 2021-03-14 LAB — URINALYSIS, COMPLETE W/RFL CULTURE
Bacteria, UA: NONE SEEN /HPF
Bilirubin Urine: NEGATIVE
Casts: NONE SEEN /LPF
Crystals: NONE SEEN /HPF
Glucose, UA: NEGATIVE
Hgb urine dipstick: NEGATIVE
Hyaline Cast: NONE SEEN /LPF
Ketones, ur: NEGATIVE
Leukocyte Esterase: NEGATIVE
Nitrites, Initial: NEGATIVE
Protein, ur: NEGATIVE
RBC / HPF: NONE SEEN /HPF (ref 0–2)
Specific Gravity, Urine: 1.004 (ref 1.001–1.035)
WBC, UA: NONE SEEN /HPF (ref 0–5)
Yeast: NONE SEEN /HPF
pH: 6 (ref 5.0–8.0)

## 2021-03-14 LAB — WET PREP FOR TRICH, YEAST, CLUE

## 2021-03-14 LAB — NO CULTURE INDICATED

## 2021-03-14 MED ORDER — FLUCONAZOLE 150 MG PO TABS: 150.0000 mg | ORAL_TABLET | ORAL | 0 refills | Status: DC

## 2021-03-14 NOTE — Progress Notes (Signed)
Dana Reed June 11, 1991 [id number]   History:  30 y.o. [id number] presents as new patient to establish care. Complaining of vaginal itching and burning with urination. Itching is external. Reports having vaginal itching the last few months after her menses. She has been treated elsewhere for this. Also had BV at one point during that time. Monthly cycles. Normal pap history. 2007 ovarian cystectomy. She had Mirena in the past and would like another one. Interpreter present during visit.   Gynecologic History Patient's last menstrual period was 02/27/2021. Period Cycle (Days): 28 Period Duration (Days): 6 Period Pattern: Regular Menstrual Flow: Heavy, Moderate Dysmenorrhea: (!) Severe Dysmenorrhea Symptoms: Cramping Contraception/Family planning: none Sexually active: Yes  Health Maintenance Last Pap: 2014. Results were: Normal Last mammogram: Not indicated  Last colonoscopy: Not indicated Last Dexa: Not indicated  Past medical history, past surgical history, family history and social history were all reviewed and documented in the EPIC chart. 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  ROS:  A ROS was performed and pertinent positives and negatives are included.  Exam:  Vitals:   03/14/21 1022  BP: 122/76  Weight: 151 lb (68.5 kg)  Height: 5\' 3"  (1.6 m)   Body mass index is 26.75 kg/m.  General appearance:  Normal Thyroid:  Symmetrical, normal in size, without palpable masses or nodularity. Respiratory  Auscultation:  Clear without wheezing or rhonchi Cardiovascular  Auscultation:  Regular rate, without rubs, murmurs or gallops  Edema/varicosities:  Not grossly evident Abdominal  Soft,nontender, without masses, guarding or rebound.  Liver/spleen:  No organomegaly noted  Hernia:  None appreciated  Skin  Inspection:  Grossly normal Breasts: Examined lying and sitting.   Right: Without masses, retractions, nipple discharge or axillary  adenopathy.   Left: Without masses, retractions, nipple discharge or axillary adenopathy. Genitourinary   Inguinal/mons:  Normal without inguinal adenopathy  External genitalia:  Normal appearing vulva with no masses, tenderness, or lesions  BUS/Urethra/Skene's glands:  Normal  Vagina:  Normal appearing with normal color and discharge, no lesions  Cervix:  Normal appearing without discharge or lesions  Uterus:  Normal in size, shape and contour.  Midline and mobile, nontender  Adnexa/parametria:     Rt: Normal in size, without masses or tenderness.   Lt: Normal in size, without masses or tenderness.  Anus and perineum: Normal  Wet prep + yeast UA negative  Patient informed chaperone available to be present for breast and pelvic exam. Patient has requested no chaperone to be present. Patient has been advised what will be completed during breast and pelvic exam.   Assessment/Plan:  30 y.o. 26 for annual exam.   Well female exam with routine gynecological exam - Plan: Cytology - PAP( [place]). Education provided on SBEs, importance of preventative screenings, current guidelines, high calcium diet, regular exercise, and multivitamin daily.   Vagina itching - Plan: WET PREP FOR TRICH, YEAST, CLUE  Dysuria - Plan: Urinalysis,Complete w/RFL Culture  Encounter for other general counseling and advice on contraception - had Mirena in the past and would like this again. She is aware of FDA approval x 8 years for pregnancy prevention. Provided her with out of pocket costs and she will call back to schedule and knows to schedule during menses.   Vulvovaginal candidiasis - Plan: fluconazole (DIFLUCAN) 150 MG tablet today and repeat in 3 days for total of 2 doses. Discussed avoiding soaps/detergents with fragrance, changing out of wet clothing, and taking probiotic. She does wear panty liners often and may  be best to avoid these as well. If symptoms recur she will let us know and we will  consider preventative treatment after menses.   Screening for cervical cancer - Normal Pap history.  Pap obtained today.   Return in 1 year for annual.   Olivia Mackie DNP, 11:09 AM 03/14/2021

## 2021-03-16 LAB — CYTOLOGY - PAP
Comment: NEGATIVE
Diagnosis: NEGATIVE
High risk HPV: NEGATIVE

## 2021-03-23 ENCOUNTER — Other Ambulatory Visit: Payer: Self-pay

## 2021-03-23 ENCOUNTER — Ambulatory Visit (INDEPENDENT_AMBULATORY_CARE_PROVIDER_SITE_OTHER): Payer: Self-pay | Admitting: Obstetrics & Gynecology

## 2021-03-23 ENCOUNTER — Encounter: Payer: Self-pay | Admitting: Obstetrics & Gynecology

## 2021-03-23 VITALS — BP 102/64

## 2021-03-23 DIAGNOSIS — N898 Other specified noninflammatory disorders of vagina: Secondary | ICD-10-CM

## 2021-03-23 DIAGNOSIS — N9089 Other specified noninflammatory disorders of vulva and perineum: Secondary | ICD-10-CM

## 2021-03-23 LAB — WET PREP FOR TRICH, YEAST, CLUE

## 2021-03-23 MED ORDER — FLUCONAZOLE 150 MG PO TABS: 150.0000 mg | ORAL_TABLET | Freq: Once | ORAL | 2 refills | Status: AC

## 2021-03-23 MED ORDER — TINIDAZOLE 500 MG PO TABS: 1000.0000 mg | ORAL_TABLET | Freq: Two times a day (BID) | ORAL | 0 refills | Status: AC

## 2021-03-23 NOTE — Progress Notes (Signed)
    Dana Reed 05/24/1991 [id number]        29 y.o.  [id number]   RP: Recurrence of vaginal/vulvar itching and burning  HPI: Treated with Fluconazole 03/14/2021 with improvement, but now symptoms are back.  Allergy to latex condoms, using non-latex.  No pelvic pain.  No fever.   OB History  Gravida Para Term Preterm AB Living  2 2 2  0 0 2  SAB IAB Ectopic Multiple Live Births  0 0 0 0 2    # Outcome Date GA Lbr Len/2nd Weight Sex Delivery Anes PTL Lv  2 Term 08/26/13 [redacted]w[redacted]d 04:25 / 00:18 6 lb 12.1 oz (3.065 kg) M Vag-Spont EPI  LIV     Birth Comments: None noted  1 Term 06/02/11 [redacted]w[redacted]d 13:40 / 00:59 6 lb 6.5 oz (2.906 kg) F Vag-Spont EPI  LIV     Birth Comments: wnl    Past medical history,surgical history, problem list, medications, allergies, family history and social history were all reviewed and documented in the EPIC chart.   Directed ROS with pertinent positives and negatives documented in the history of present illness/assessment and plan.  Exam:  Vitals:   03/23/21 1032  BP: 102/64   General appearance:  Normal  Gynecologic exam: Vulva: Mild inflammation.  Speculum:  Cervix/Vagina normal.  Increased discharge.  Wet prep done.  Wet prep:  Clue cells and odor present   Assessment/Plan:  29 y.o. 05/23/21   1. Vagina itching Bacterial vaginosis confirmed by wet prep.  Will treat with Tinidazole 2 tab PO BID x 2 days.  Fluconazole 1 tab PO after antibiotic. - WET PREP FOR TRICH, YEAST, CLUE  2. Vulvar irritation May continue to use her cream with antifungal/CS. - WET PREP FOR TRICH, YEAST, CLUE  Other orders - tinidazole (TINDAMAX) 500 MG tablet; Take 2 tablets (1,000 mg total) by mouth 2 (two) times daily for 2 days. - fluconazole (DIFLUCAN) 150 MG tablet; Take 1 tablet (150 mg total) by mouth once for 1 dose.   [id number] MD, 10:40 AM 03/23/2021

## 2021-04-13 ENCOUNTER — Ambulatory Visit (INDEPENDENT_AMBULATORY_CARE_PROVIDER_SITE_OTHER): Payer: Self-pay | Admitting: Nurse Practitioner

## 2021-04-13 ENCOUNTER — Other Ambulatory Visit: Payer: Self-pay

## 2021-04-13 ENCOUNTER — Encounter: Payer: Self-pay | Admitting: Nurse Practitioner

## 2021-04-13 VITALS — BP 110/70

## 2021-04-13 DIAGNOSIS — N898 Other specified noninflammatory disorders of vagina: Secondary | ICD-10-CM

## 2021-04-13 DIAGNOSIS — B3731 Acute candidiasis of vulva and vagina: Secondary | ICD-10-CM

## 2021-04-13 LAB — WET PREP FOR TRICH, YEAST, CLUE

## 2021-04-13 MED ORDER — FLUCONAZOLE 100 MG PO TABS: 100.0000 mg | ORAL_TABLET | Freq: Every day | ORAL | 0 refills | Status: DC

## 2021-04-13 NOTE — Progress Notes (Signed)
   Acute Office Visit  Subjective:    Patient ID: Dana Reed, female    DOB: 05/15/1991, 30 y.o.   MRN: [id number]   HPI 30 y.o. presents today for vaginal discharge. Was treated for yeast 03/14/2021 and then BV 03/23/2021. She reports getting yeast infections most months after her menses. These symptoms start 1 week after menses end. At previous visit we discussed prophylactic treatment if this recurs. Interpreter present during visit.    Review of Systems  Constitutional: Negative.   Genitourinary:  Positive for vaginal discharge.       Vaginal itching      Objective:    Physical Exam Constitutional:      Appearance: Normal appearance.  Genitourinary:    General: Normal vulva.     Vagina: Vaginal discharge present. No erythema.     Cervix: Normal.    BP 110/70   LMP 04/01/2021  Wt Readings from Last 3 Encounters:  03/14/21 151 lb (68.5 kg)  12/02/20 155 lb 10.3 oz (70.6 kg)  03/25/14 155 lb 9.6 oz (70.6 kg)   Wet prep negative     Assessment & Plan:   Problem List Items Addressed This Visit   None Visit Diagnoses     Vaginal candidiasis    -  Primary   Relevant Medications   fluconazole (DIFLUCAN) 100 MG tablet   Vaginal itching       Relevant Orders   WET PREP FOR TRICH, YEAST, CLUE      Plan: Wet prep negative. Will provide her with Diflucan 100 mg #6 with instructions to take 1 tablet at the end of her menses each month to prevent recurring yeast infections. She is agreeable to plan.      Olivia Mackie DNP, 3:25 PM 04/13/2021

## 2022-03-19 ENCOUNTER — Ambulatory Visit: Payer: Self-pay | Admitting: Nurse Practitioner

## 2022-03-27 ENCOUNTER — Encounter: Payer: Self-pay | Admitting: Nurse Practitioner

## 2022-03-27 ENCOUNTER — Ambulatory Visit (INDEPENDENT_AMBULATORY_CARE_PROVIDER_SITE_OTHER): Payer: Self-pay | Admitting: Nurse Practitioner

## 2022-03-27 VITALS — BP 108/68 | HR 81 | Ht 63.0 in | Wt 165.0 lb

## 2022-03-27 DIAGNOSIS — N926 Irregular menstruation, unspecified: Secondary | ICD-10-CM

## 2022-03-27 DIAGNOSIS — Z3009 Encounter for other general counseling and advice on contraception: Secondary | ICD-10-CM

## 2022-03-27 DIAGNOSIS — Z01419 Encounter for gynecological examination (general) (routine) without abnormal findings: Secondary | ICD-10-CM

## 2022-03-27 NOTE — Progress Notes (Signed)
   Dana Reed 12-21-1990 [id number]   History:  31 y.o. G2P2002 presents for annual exam. Cycle last month longer than normal ~ 45 days. Had increased stress. Took 3 UPTs at home, all negative. Menses started 03/06/2022. Normal pap history. 2007 ovarian cystectomy. Was considering getting an IUD last year and is still considering. Interpreter present during visit.   Gynecologic History Patient's last menstrual period was 03/06/2022 (exact date). Period Cycle (Days):  (28-45) Period Duration (Days): 5 Menstrual Flow:  (light-moderate) Menstrual Control: Maxi pad Dysmenorrhea: (!) Moderate Dysmenorrhea Symptoms: Cramping, Headache Contraception/Family planning: none Sexually active: Yes  Health Maintenance Last Pap: 03/14/2021. Results were: Normal neg HPV Last mammogram: Not indicated  Last colonoscopy: Not indicated Last Dexa: Not indicated  Past medical history, past surgical history, family history and social history were all reviewed and documented in the EPIC chart. 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  ROS:  A ROS was performed and pertinent positives and negatives are included.  Exam:  Vitals:   03/27/22 0852  BP: 108/68  Pulse: 81  SpO2: 98%  Weight: 165 lb (74.8 kg)  Height: 5\' 3"  (1.6 m)    Body mass index is 29.23 kg/m.  General appearance:  Normal Thyroid:  Symmetrical, normal in size, without palpable masses or nodularity. Respiratory  Auscultation:  Clear without wheezing or rhonchi Cardiovascular  Auscultation:  Regular rate, without rubs, murmurs or gallops  Edema/varicosities:  Not grossly evident Abdominal  Soft,nontender, without masses, guarding or rebound.  Liver/spleen:  No organomegaly noted  Hernia:  None appreciated  Skin  Inspection:  Grossly normal Breasts: Examined lying and sitting.   Right: Without masses, retractions, nipple discharge or axillary adenopathy.   Left: Without masses, retractions, nipple  discharge or axillary adenopathy. Genitourinary   Inguinal/mons:  Normal without inguinal adenopathy  External genitalia:  Normal appearing vulva with no masses, tenderness, or lesions  BUS/Urethra/Skene's glands:  Normal  Vagina:  Normal appearing with normal color and discharge, no lesions  Cervix:  Normal appearing without discharge or lesions  Uterus:  Normal in size, shape and contour.  Midline and mobile, nontender  Adnexa/parametria:     Rt: Normal in size, without masses or tenderness.   Lt: Normal in size, without masses or tenderness.  Anus and perineum: Normal  Patient informed chaperone available to be present for breast and pelvic exam. Patient has requested no chaperone to be present. Patient has been advised what will be completed during breast and pelvic exam.   Assessment/Plan:  31 y.o. [id number] for annual exam.   Well female exam with routine gynecological exam - Education provided on SBEs, importance of preventative screenings, current guidelines, high calcium diet, regular exercise, and multivitamin daily.   Encounter for other general counseling and advice on contraception - had Mirena in the past and would like this again. She is aware of FDA approval x 8 years for pregnancy prevention. Provided her with out of pocket costs and she will call back to schedule and knows to schedule during menses.   Irregular menses - Cycle last month longer than normal ~ 45 days. Had increased stress. Took 3 UPTs at home, all negative. Menses started 03/06/2022. Will monitor since only one irregular cycle last month.   Screening for cervical cancer - Normal Pap history.  Will repeat at 5-year interval per guidelines.   Return in 1 year for annual.     Tamela Gammon DNP, 9:38 AM 03/27/2022

## 2022-04-19 ENCOUNTER — Telehealth: Payer: Self-pay | Admitting: *Deleted

## 2022-04-19 ENCOUNTER — Other Ambulatory Visit: Payer: Self-pay

## 2022-04-19 DIAGNOSIS — R300 Dysuria: Secondary | ICD-10-CM

## 2022-04-19 LAB — URINALYSIS, COMPLETE W/RFL CULTURE
Bilirubin Urine: NEGATIVE
Hyaline Cast: NONE SEEN /LPF
Ketones, ur: NEGATIVE
Protein, ur: NEGATIVE
Specific Gravity, Urine: 1.006 (ref 1.001–1.035)
pH: 5.5 (ref 5.0–8.0)

## 2022-04-19 NOTE — Telephone Encounter (Signed)
Dana Reed patient  Patient called c/o UTI symptoms, burning with urination, frequent urination, asked if Rx could be sent to pharmacy to treat symptoms. Please advise

## 2022-04-19 NOTE — Addendum Note (Signed)
Addended by: Thamas Jaegers on: 04/19/2022 12:44 PM   Modules accepted: Orders

## 2022-04-19 NOTE — Telephone Encounter (Signed)
Claudia to relay

## 2022-04-19 NOTE — Telephone Encounter (Signed)
Patient preferred to come in and leave u/a, order placed. Lab appointment scheduled.

## 2022-04-22 LAB — URINE CULTURE
MICRO NUMBER:: 14141394
SPECIMEN QUALITY:: ADEQUATE

## 2022-04-22 LAB — URINALYSIS, COMPLETE W/RFL CULTURE
Glucose, UA: NEGATIVE
Nitrites, Initial: NEGATIVE

## 2022-04-22 LAB — CULTURE INDICATED

## 2022-09-09 ENCOUNTER — Encounter: Payer: Self-pay | Admitting: Nurse Practitioner

## 2022-09-09 ENCOUNTER — Ambulatory Visit (INDEPENDENT_AMBULATORY_CARE_PROVIDER_SITE_OTHER): Payer: Self-pay | Admitting: Nurse Practitioner

## 2022-09-09 VITALS — BP 110/70 | HR 62 | Wt 167.0 lb

## 2022-09-09 DIAGNOSIS — L309 Dermatitis, unspecified: Secondary | ICD-10-CM

## 2022-09-09 DIAGNOSIS — R300 Dysuria: Secondary | ICD-10-CM

## 2022-09-09 DIAGNOSIS — N9489 Other specified conditions associated with female genital organs and menstrual cycle: Secondary | ICD-10-CM

## 2022-09-09 LAB — URINALYSIS, COMPLETE W/RFL CULTURE
Bilirubin Urine: NEGATIVE
Crystals: NONE SEEN /HPF
Glucose, UA: NEGATIVE
Hyaline Cast: NONE SEEN /LPF
Leukocyte Esterase: NEGATIVE
Yeast: NONE SEEN /HPF

## 2022-09-09 LAB — WET PREP FOR TRICH, YEAST, CLUE

## 2022-09-09 MED ORDER — CLOBETASOL PROPIONATE 0.05 % EX OINT: 1.0000 | TOPICAL_OINTMENT | Freq: Two times a day (BID) | CUTANEOUS | 0 refills | Status: DC

## 2022-09-09 NOTE — Progress Notes (Signed)
   Acute Office Visit  Subjective:    Patient ID: Dana Reed, female    DOB: [birth year], 32 y.o.   MRN: [id number]   HPI 32 y.o. presents today for vulvar discomfort and burning x 10 days. Treated for yeast by PCP last week. Symptoms unchanged. Was then provided antifungal + steroid cream but no improvement. H/O recurrent yeast infections. Was prescribed Diflucan weekly but only took twice because she is unsure if she should take them. Had allergic reaction of vulvar of unknown cause in the past and feels this is similar. Negative GC/CT recently per patient. No changes in soaps, detergents, or lubrications. Reports burning with urination, pain with intercourse, and lower abdominal pressure with urinating. Has some occasional urinary leakage and wears pads. Feels these irritate her. Interpreter present during visit.    Review of Systems  Constitutional: Negative.   Genitourinary:  Positive for dyspareunia, dysuria, pelvic pain, vaginal discharge and vaginal pain. Negative for difficulty urinating, flank pain, frequency, genital sores and hematuria.       Objective:    Physical Exam Constitutional:      Appearance: Normal appearance.  Genitourinary:    Labia:        Right: No tenderness, lesion or injury.        Left: No tenderness, lesion or injury.      Vagina: Normal.     Cervix: Normal.     Comments: Generalized vulvar redness    BP 110/70   Pulse 62   Wt 167 lb (75.8 kg)   LMP 08/11/2022   SpO2 100%   BMI 29.58 kg/m  Wt Readings from Last 3 Encounters:  09/09/22 167 lb (75.8 kg)  03/27/22 165 lb (74.8 kg)  03/14/21 151 lb (68.5 kg)        Patient informed chaperone available to be present for breast and/or pelvic exam. Patient has requested no chaperone to be present. Patient has been advised what will be completed during breast and pelvic exam.   Wet prep negative for pathogens UA negative  Assessment & Plan:   Problem List Items Addressed This  Visit   None Visit Diagnoses     Vulvar dermatitis    -  Primary   Relevant Medications   clobetasol ointment (TEMOVATE) 0.05 %   Vulvar burning       Relevant Orders   WET PREP FOR [place], YEAST, CLUE (Completed)   Burning with urination       Relevant Orders   Urinalysis,Complete w/RFL Culture (Completed)      Plan: Negative wet prep and UA. Redness present on exterior labia majora only. Recommend Clobetasol ointment BID x 7-10 days. Continue Diflucan tablets weekly as recommended. Avoid incontinence pads as these seem to be irritating her. Avoid harsh soap and detergents.      Tamela Gammon DNP, 10:40 AM 09/09/2022

## 2022-09-11 LAB — URINE CULTURE
MICRO NUMBER:: 14736162
SPECIMEN QUALITY:: ADEQUATE

## 2022-09-11 LAB — URINALYSIS, COMPLETE W/RFL CULTURE
Casts: NONE SEEN /LPF
Ketones, ur: NEGATIVE
Nitrites, Initial: NEGATIVE
Protein, ur: NEGATIVE
Specific Gravity, Urine: 1.025 (ref 1.001–1.035)
pH: 5.5 (ref 5.0–8.0)

## 2022-09-11 LAB — CULTURE INDICATED

## 2022-09-26 ENCOUNTER — Ambulatory Visit (INDEPENDENT_AMBULATORY_CARE_PROVIDER_SITE_OTHER): Payer: Self-pay | Admitting: Nurse Practitioner

## 2022-09-26 VITALS — BP 126/70 | HR 66 | Wt 164.0 lb

## 2022-09-26 DIAGNOSIS — N9089 Other specified noninflammatory disorders of vulva and perineum: Secondary | ICD-10-CM

## 2022-09-26 MED ORDER — VALACYCLOVIR HCL 1 G PO TABS: 1000.0000 mg | ORAL_TABLET | Freq: Two times a day (BID) | ORAL | 0 refills | Status: AC

## 2022-09-26 NOTE — Progress Notes (Signed)
   Acute Office Visit  Subjective:    Patient ID: Dana Reed, female    DOB: 1990-07-25, 32 y.o.   MRN: [id number]   HPI 32 y.o. presents today for burning/itching. Seen 09/09/22 with complaints of vulvar discomfort and burning x 10 days. Negative wet prep and UA at that time. Negative STD screening. PCP had treated for yeast and provided weekly Diflucan for recurrent yeast infections. She did restart those after self discontinuing. Clobetasol ointment provided and she used for 7 days. Minimal improvement and symptoms today are different. Reports burning at clitoris today. Reports having similar episode about 10 years ago and is not sure if it was Gonorrhea or HSV. Has had intermittent mild burning episodes since then. Interpreter present during visit.   Patient's last menstrual period was 08/11/2022.    Review of Systems  Constitutional: Negative.   Genitourinary:  Positive for genital sores and vaginal pain.       Objective:    Physical Exam Constitutional:      Appearance: Normal appearance.  Genitourinary:      BP 126/70   Pulse 66   Wt 164 lb (74.4 kg)   LMP 08/11/2022   BMI 29.05 kg/m  Wt Readings from Last 3 Encounters:  09/26/22 164 lb (74.4 kg)  09/09/22 167 lb (75.8 kg)  03/27/22 165 lb (74.8 kg)        Patient informed chaperone available to be present for breast and/or pelvic exam. Patient has requested no chaperone to be present. Patient has been advised what will be completed during breast and pelvic exam.   Assessment & Plan:   Problem List Items Addressed This Visit   None Visit Diagnoses     Vulvar lesion    -  Primary   Relevant Medications   valACYclovir (VALTREX) 1000 MG tablet   Other Relevant Orders   SureSwab HSV, Type 1/2 DNA, PCR      Plan: Suspicious for HSV. Culture obtained. Valtrex 1G BID x 7 days. OTC lidocaine gel as needed.      Olivia Mackie DNP, 12:31 PM 09/26/2022

## 2022-09-30 ENCOUNTER — Other Ambulatory Visit: Payer: Self-pay | Admitting: Nurse Practitioner

## 2022-09-30 DIAGNOSIS — A609 Anogenital herpesviral infection, unspecified: Secondary | ICD-10-CM

## 2022-09-30 LAB — SURESWAB HSV, TYPE 1/2 DNA, PCR
HSV 1 DNA: DETECTED — AB
HSV 2 DNA: NOT DETECTED

## 2022-09-30 MED ORDER — VALACYCLOVIR HCL 500 MG PO TABS: 500.0000 mg | ORAL_TABLET | Freq: Two times a day (BID) | ORAL | 1 refills | Status: DC

## 2023-03-31 ENCOUNTER — Ambulatory Visit: Payer: Self-pay | Admitting: Nurse Practitioner

## 2023-05-06 ENCOUNTER — Encounter: Payer: Self-pay | Admitting: Nurse Practitioner

## 2023-05-07 ENCOUNTER — Ambulatory Visit (INDEPENDENT_AMBULATORY_CARE_PROVIDER_SITE_OTHER): Payer: Self-pay | Admitting: Nurse Practitioner

## 2023-05-07 ENCOUNTER — Encounter: Payer: Self-pay | Admitting: Nurse Practitioner

## 2023-05-07 VITALS — BP 102/72 | HR 90 | Ht 63.5 in | Wt 168.0 lb

## 2023-05-07 DIAGNOSIS — E559 Vitamin D deficiency, unspecified: Secondary | ICD-10-CM

## 2023-05-07 DIAGNOSIS — R300 Dysuria: Secondary | ICD-10-CM

## 2023-05-07 DIAGNOSIS — A609 Anogenital herpesviral infection, unspecified: Secondary | ICD-10-CM

## 2023-05-07 DIAGNOSIS — B3731 Acute candidiasis of vulva and vagina: Secondary | ICD-10-CM

## 2023-05-07 DIAGNOSIS — L292 Pruritus vulvae: Secondary | ICD-10-CM

## 2023-05-07 DIAGNOSIS — R61 Generalized hyperhidrosis: Secondary | ICD-10-CM

## 2023-05-07 DIAGNOSIS — R5383 Other fatigue: Secondary | ICD-10-CM

## 2023-05-07 DIAGNOSIS — Z01419 Encounter for gynecological examination (general) (routine) without abnormal findings: Secondary | ICD-10-CM

## 2023-05-07 LAB — WET PREP FOR TRICH, YEAST, CLUE

## 2023-05-07 MED ORDER — FLUCONAZOLE 150 MG PO TABS: 150.0000 mg | ORAL_TABLET | ORAL | 0 refills | Status: DC

## 2023-05-07 NOTE — Progress Notes (Signed)
Dana Reed [birth year] [id number]   History:  32 y.o. G2P2002 presents for annual exam. Complains of vulvar itching and increased discharge since yesterday. Also having some urgency and dysuria. Monthly cycles. Normal pap history. 2007 ovarian cystectomy. HSV, valtrex as needed. Having night sweats and sleep disturbance for a while now. Interpreter present during visit.   Gynecologic History Patient's last menstrual period was 04/12/2023 (approximate). Period Cycle (Days):  (28-30) Period Duration (Days): 6 Menstrual Flow:  (moderate 1-2 days and then lightens) Menstrual Control: Maxi pad Dysmenorrhea: (!) Severe Dysmenorrhea Symptoms: Cramping, Headache Contraception/Family planning: none Sexually active: Yes  Health Maintenance Last Pap: 03/14/2021. Results were: Normal neg HPV Last mammogram: Not indicated  Last colonoscopy: Not indicated Last Dexa: Not indicated  Past medical history, past surgical history, family history and social history were all reviewed and documented in the EPIC chart. Married. Cleans houses. 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  ROS:  A ROS was performed and pertinent positives and negatives are included.  Exam:  Vitals:   05/07/23 1004  BP: 102/72  Pulse: 90  SpO2: 97%  Weight: 168 lb (76.2 kg)  Height: 5' 3.5" (1.613 m)     Body mass index is 29.29 kg/m.  General appearance:  Normal Thyroid:  Symmetrical, normal in size, without palpable masses or nodularity. Respiratory  Auscultation:  Clear without wheezing or rhonchi Cardiovascular  Auscultation:  Regular rate, without rubs, murmurs or gallops  Edema/varicosities:  Not grossly evident Abdominal  Soft,nontender, without masses, guarding or rebound.  Liver/spleen:  No organomegaly noted  Hernia:  None appreciated  Skin  Inspection:  Grossly normal Breasts: Examined lying and sitting.   Right: Without masses, retractions, nipple discharge or axillary adenopathy.   Left: Without  masses, retractions, nipple discharge or axillary adenopathy. Pelvic: External genitalia:  no lesions, some redness              Urethra:  normal appearing urethra with no masses, tenderness or lesions              Bartholins and Skenes: normal                 Vagina: White, chunky discharge, mild erythema              Cervix: no lesions Bimanual Exam:  Uterus:  no masses or tenderness              Adnexa: no mass, fullness, tenderness              Rectovaginal: Deferred              Anus:  normal, no lesions  Patient informed chaperone available to be present for breast and pelvic exam. Patient has requested no chaperone to be present. Patient has been advised what will be completed during breast and pelvic exam.   UA neg Wet prep + yeast (budding noted)  Assessment/Plan:  32 y.o. [id number] for annual exam.   Well female exam with routine gynecological exam - Plan: CBC with Differential/Platelet, Comprehensive metabolic panel. Education provided on SBEs, importance of preventative screenings, current guidelines, high calcium diet, regular exercise, and multivitamin daily.   HSV (herpes simplex virus) anogenital infection - no outbreaks since April, Valtrex prescribed as needed.   Vulvar itching - Plan: WET PREP FOR TRICH, YEAST, CLUE. + yeast  Dysuria - Plan: Urinalysis,Complete w/RFL Culture. UA unremarkable.   Vaginal candidiasis - Plan: fluconazole (DIFLUCAN) 150 MG tablet today and repeat in 3 days for total  of 2 doses.   Night sweats - Plan: TSH  Fatigue, unspecified type - Plan: TSH, VITAMIN D 25 Hydroxy (Vit-D Deficiency, Fractures), CBC with Differential/Platelet, Comprehensive metabolic panel, Vitamin B12  Screening for cervical cancer - Normal Pap history.  Will repeat at 5-year interval per guidelines.   Return in about 1 year (around 05/06/2024) for Annual.    Olivia Mackie DNP, 10:49 AM 05/07/2023

## 2023-05-08 LAB — CBC WITH DIFFERENTIAL/PLATELET
Absolute Lymphocytes: 2347 {cells}/uL (ref 850–3900)
Absolute Monocytes: 547 {cells}/uL (ref 200–950)
Basophils Absolute: 50 {cells}/uL (ref 0–200)
Basophils Relative: 0.7 %
Eosinophils Absolute: 72 {cells}/uL (ref 15–500)
Eosinophils Relative: 1 %
HCT: 37.6 % (ref 35.0–45.0)
Hemoglobin: 12.3 g/dL (ref 11.7–15.5)
MCH: 29.6 pg (ref 27.0–33.0)
MCHC: 32.7 g/dL (ref 32.0–36.0)
MCV: 90.4 fL (ref 80.0–100.0)
MPV: 11.5 fL (ref 7.5–12.5)
Monocytes Relative: 7.6 %
Neutro Abs: 4183 {cells}/uL (ref 1500–7800)
Neutrophils Relative %: 58.1 %
Platelets: 274 10*3/uL (ref 140–400)
RBC: 4.16 10*6/uL (ref 3.80–5.10)
RDW: 13 % (ref 11.0–15.0)
Total Lymphocyte: 32.6 %
WBC: 7.2 10*3/uL (ref 3.8–10.8)

## 2023-05-08 LAB — VITAMIN D 25 HYDROXY (VIT D DEFICIENCY, FRACTURES): Vit D, 25-Hydroxy: 20 ng/mL — ABNORMAL LOW (ref 30–100)

## 2023-05-08 LAB — COMPREHENSIVE METABOLIC PANEL
AG Ratio: 1.5 (calc) (ref 1.0–2.5)
ALT: 14 U/L (ref 6–29)
AST: 15 U/L (ref 10–30)
Albumin: 4.5 g/dL (ref 3.6–5.1)
Alkaline phosphatase (APISO): 50 U/L (ref 31–125)
BUN: 10 mg/dL (ref 7–25)
CO2: 23 mmol/L (ref 20–32)
Calcium: 9.4 mg/dL (ref 8.6–10.2)
Chloride: 105 mmol/L (ref 98–110)
Creat: 0.52 mg/dL (ref 0.50–0.97)
Globulin: 3 g/dL (ref 1.9–3.7)
Glucose, Bld: 82 mg/dL (ref 65–99)
Potassium: 3.8 mmol/L (ref 3.5–5.3)
Sodium: 138 mmol/L (ref 135–146)
Total Bilirubin: 0.3 mg/dL (ref 0.2–1.2)
Total Protein: 7.5 g/dL (ref 6.1–8.1)

## 2023-05-08 LAB — VITAMIN B12: Vitamin B-12: 684 pg/mL (ref 200–1100)

## 2023-05-08 LAB — TSH: TSH: 0.78 m[IU]/L

## 2023-05-08 MED ORDER — VITAMIN D (ERGOCALCIFEROL) 1.25 MG (50000 UNIT) PO CAPS: 50000.0000 [IU] | ORAL_CAPSULE | ORAL | 0 refills | Status: AC

## 2023-05-08 NOTE — Addendum Note (Signed)
Addended byWyline Beady on: 05/08/2023 07:51 AM   Modules accepted: Orders

## 2023-05-09 LAB — URINALYSIS, COMPLETE W/RFL CULTURE
Bilirubin Urine: NEGATIVE
Glucose, UA: NEGATIVE
Hyaline Cast: NONE SEEN /[LPF]
Ketones, ur: NEGATIVE
Leukocyte Esterase: NEGATIVE
Nitrites, Initial: NEGATIVE
Protein, ur: NEGATIVE
Specific Gravity, Urine: 1.015 (ref 1.001–1.035)
pH: 6 (ref 5.0–8.0)

## 2023-05-09 LAB — CULTURE INDICATED

## 2023-05-09 LAB — URINE CULTURE
MICRO NUMBER:: 15756692
Result:: NO GROWTH
SPECIMEN QUALITY:: ADEQUATE

## 2023-05-28 ENCOUNTER — Telehealth: Payer: Self-pay | Admitting: *Deleted

## 2023-05-28 NOTE — Telephone Encounter (Signed)
-----   Message from [place] V sent at 05/28/2023  9:44 AM EST ----- Inbound call from pt stating she had a hsv outbreak 2 wks ago and took the medication for it.  States she feels is having another outbreak today and is wanting to know if ok to take meds again.  Please advice.

## 2023-05-28 NOTE — Telephone Encounter (Signed)
Yes, OK to try Valtrex first.

## 2023-05-28 NOTE — Telephone Encounter (Signed)
Call returned to patient using [facility] Interpreter ID# [phone].   Spoke with patient. Seen for AEX 05/07/23, tx for yeast, symptoms resolved. Patient reports Hx of HSX, has Rx for valtrex.   Reports symptoms of external itching, redness and burning started after menses. Red white bumps present on vulva. Denies pain, odor or d/c. Reports urinary frequency.   Advised OV recommended for further evaluation, patient asking if ok to try valtrex at this time?   Advised ok to start Valtrex, would need OV for further evaluation of urinary frequency. I will review with Tiffany and return call with recommendations.

## 2023-05-28 NOTE — Telephone Encounter (Signed)
Call returned to patient with [facility] Interpreter ID [phone]. Advised per Tiffany. Questions answered. Patient verbalizes understanding and is agreeable.   Encounter closed.

## 2023-07-26 ENCOUNTER — Other Ambulatory Visit: Payer: Self-pay | Admitting: Nurse Practitioner

## 2023-07-26 DIAGNOSIS — E559 Vitamin D deficiency, unspecified: Secondary | ICD-10-CM

## 2023-07-28 NOTE — Telephone Encounter (Signed)
 Medication refill request: Vitamin D   Last AEX:  05/07/23 Next AEX: 05/11/24 Last MMG (if hormonal medication request): n/a Refill authorized: please advise

## 2023-07-29 NOTE — Telephone Encounter (Signed)
Will decline high dose supplement. Recommend taking OTC 2000 international units daily for maintenance.

## 2023-09-08 ENCOUNTER — Ambulatory Visit (INDEPENDENT_AMBULATORY_CARE_PROVIDER_SITE_OTHER): Payer: Self-pay | Admitting: Nurse Practitioner

## 2023-09-08 ENCOUNTER — Encounter: Payer: Self-pay | Admitting: Nurse Practitioner

## 2023-09-08 VITALS — BP 124/74 | HR 81

## 2023-09-08 DIAGNOSIS — B3731 Acute candidiasis of vulva and vagina: Secondary | ICD-10-CM

## 2023-09-08 DIAGNOSIS — R102 Pelvic and perineal pain: Secondary | ICD-10-CM

## 2023-09-08 DIAGNOSIS — N898 Other specified noninflammatory disorders of vagina: Secondary | ICD-10-CM

## 2023-09-08 DIAGNOSIS — E559 Vitamin D deficiency, unspecified: Secondary | ICD-10-CM

## 2023-09-08 LAB — WET PREP FOR TRICH, YEAST, CLUE

## 2023-09-08 MED ORDER — FLUCONAZOLE 150 MG PO TABS: 150.0000 mg | ORAL_TABLET | ORAL | 0 refills | Status: DC

## 2023-09-08 NOTE — Progress Notes (Signed)
   Acute Office Visit  Subjective:    Patient ID: Dana Reed, female    DOB: [birth year], 33 y.o.   MRN: [id number]   HPI 33 y.o. presents today for vaginal discharge, odor and irritation x 3 days. Also reports urinary frequency and mild burning. Reports night sweats like she was having in the fall when she was diagnosed with Vit D deficiency. Completed 12-week course of Vit D. Has not been taking OTC daily supplement. Interpreter present.   Patient's last menstrual period was 08/04/2023.    Review of Systems  Constitutional:  Positive for fatigue.  Endocrine: Positive for heat intolerance.  Genitourinary:  Positive for dysuria, frequency, pelvic pain, vaginal discharge and vaginal pain. Negative for difficulty urinating, flank pain, genital sores, hematuria and urgency.       Objective:    Physical Exam Constitutional:      Appearance: Normal appearance.  Genitourinary:    General: Normal vulva.     Vagina: Vaginal discharge present. No erythema.     Cervix: Normal.     BP 124/74 (BP Location: Right Arm, Patient Position: Sitting, Cuff Size: Small)   Pulse 81   LMP 08/04/2023   SpO2 98%  Wt Readings from Last 3 Encounters:  05/07/23 168 lb (76.2 kg)  09/26/22 164 lb (74.4 kg)  09/09/22 167 lb (75.8 kg)        Patient informed chaperone available to be present for breast and/or pelvic exam. Patient has requested no chaperone to be present. Patient has been advised what will be completed during breast and pelvic exam.   UA neg leukocytes, neg nitrites, 2+ blood, yellow/clear. Microscopic: wbc 0-5, rbc 10-20, moderate bacteria, yeast present  Wet prep + yeast  Assessment & Plan:   Problem List Items Addressed This Visit   None Visit Diagnoses       Vaginal candidiasis    -  Primary   Relevant Medications      fluconazole (DIFLUCAN) 150 MG tablet     Pelvic pain       Relevant Orders   Urinalysis,Complete w/RFL Culture     Vaginal discharge        Relevant Orders   WET PREP FOR TRICH, YEAST, CLUE     Vitamin D deficiency       Relevant Orders   VITAMIN D 25 Hydroxy (Vit-D Deficiency, Fractures)      Plan: Wet prep + yeast - Diflucan 150 mg every 3 days x 2 doses. UA unremarkable, reflex culture pending. Will check Vit D today.   Return if symptoms worsen or fail to improve.    Olivia Mackie DNP, 1:01 PM 09/08/2023

## 2023-09-09 LAB — VITAMIN D 25 HYDROXY (VIT D DEFICIENCY, FRACTURES): Vit D, 25-Hydroxy: 32 ng/mL (ref 30–100)

## 2023-09-10 LAB — URINALYSIS, COMPLETE W/RFL CULTURE
Bilirubin Urine: NEGATIVE
Glucose, UA: NEGATIVE
Hyaline Cast: NONE SEEN /LPF
Ketones, ur: NEGATIVE
Leukocyte Esterase: NEGATIVE
Nitrites, Initial: NEGATIVE
Protein, ur: NEGATIVE
Specific Gravity, Urine: 1.025 (ref 1.001–1.035)
pH: 5.5 (ref 5.0–8.0)

## 2023-09-10 LAB — URINE CULTURE
MICRO NUMBER:: 16238355
Result:: NO GROWTH
SPECIMEN QUALITY:: ADEQUATE

## 2023-09-10 LAB — CULTURE INDICATED

## 2023-10-02 ENCOUNTER — Other Ambulatory Visit: Payer: Self-pay | Admitting: Nurse Practitioner

## 2023-10-02 ENCOUNTER — Telehealth: Payer: Self-pay

## 2023-10-02 DIAGNOSIS — B3731 Acute candidiasis of vulva and vagina: Secondary | ICD-10-CM

## 2023-10-02 MED ORDER — FLUCONAZOLE 150 MG PO TABS: 150.0000 mg | ORAL_TABLET | ORAL | 1 refills | Status: DC

## 2023-10-02 NOTE — Telephone Encounter (Signed)
 Spoke to patient using [employer] ID [phone].  Patient complains of vaginal infection, vaginal discharge, itching, no odor, pelvic discomfort, urinary frequency, no dysuria, denies fever.  Symptoms began yesterday.  Patient was treated for vaginal yeast infection 09/08/23 with # 2 Diflucan.  Symptoms resolved after taking medication.  Patient states symptoms are the same as her office visit 09/07/33.  Sent to provider for review.  Please Advise.

## 2023-10-02 NOTE — Telephone Encounter (Signed)
Diflucan sent to pharmacy. Thanks.  

## 2023-10-08 ENCOUNTER — Encounter: Payer: Self-pay | Admitting: Obstetrics and Gynecology

## 2023-10-08 ENCOUNTER — Ambulatory Visit (INDEPENDENT_AMBULATORY_CARE_PROVIDER_SITE_OTHER): Payer: Self-pay | Admitting: Obstetrics and Gynecology

## 2023-10-08 VITALS — BP 110/68 | HR 79

## 2023-10-08 DIAGNOSIS — R350 Frequency of micturition: Secondary | ICD-10-CM

## 2023-10-08 DIAGNOSIS — N9489 Other specified conditions associated with female genital organs and menstrual cycle: Secondary | ICD-10-CM

## 2023-10-08 MED ORDER — TERCONAZOLE 0.4 % VA CREA: 1.0000 | TOPICAL_CREAM | Freq: Every day | VAGINAL | 1 refills | Status: AC

## 2023-10-08 MED ORDER — VALACYCLOVIR HCL 500 MG PO TABS: 500.0000 mg | ORAL_TABLET | Freq: Two times a day (BID) | ORAL | 12 refills | Status: DC

## 2023-10-08 MED ORDER — VALACYCLOVIR HCL 500 MG PO TABS: 500.0000 mg | ORAL_TABLET | Freq: Three times a day (TID) | ORAL | 3 refills | Status: AC

## 2023-10-08 NOTE — Progress Notes (Addendum)
   Acute Office Visit  Subjective:    Patient ID: Dana Reed, female    DOB: 1991/04/30, 33 y.o.   MRN: [id number]   HPI 33 y.o. presents today for Vaginitis (Pt c/o vaginal itching, burning, redness, urinary frequency//jj) . Tried two doses of diflucan  with no success and started valtrex  (took 2 tablets) but still having pain and redness and itching.  Patient's last menstrual period was 09/23/2023 (exact date). Period Duration (Days): 6 Period Pattern: Regular Menstrual Flow:  (heavy for 2 days) Menstrual Control: Maxi pad Dysmenorrhea: (!) Severe Dysmenorrhea Symptoms: Cramping, Diarrhea  Review of Systems     Objective:    OBGyn Exam  BP 110/68   Pulse 79   LMP 09/23/2023 (Exact Date)   SpO2 99%  Wt Readings from Last 3 Encounters:  05/07/23 168 lb (76.2 kg)  09/26/22 164 lb (74.4 kg)  09/09/22 167 lb (75.8 kg)   SVE: no lesions, no blisters. Redness on b/l labia c/w with possible yeast Cervix with nabothian cyst but no apparent abnormal discharge     Patient informed chaperone available to be present for breast and/or pelvic exam. Patient has requested no chaperone to be present. Patient has been advised what will be completed during breast and pelvic exam.   Assessment & Plan:  H/o HSV, vaginal burning likely persistent yeast infection Continue valtrex  for 10 days. Refill sent Vaginal terazol at bedtime x 10days. Patient instructed to return with persistent or worsening s/s Nu swab sent    [name]

## 2023-10-09 LAB — SURESWAB® ADVANCED VAGINITIS PLUS,TMA
C. trachomatis RNA, TMA: NOT DETECTED
CANDIDA SPECIES: NOT DETECTED
Candida glabrata: NOT DETECTED
N. gonorrhoeae RNA, TMA: NOT DETECTED
SURESWAB(R) ADV BACTERIAL VAGINOSIS(BV),TMA: NEGATIVE
TRICHOMONAS VAGINALIS (TV),TMA: NOT DETECTED

## 2023-10-10 LAB — URINALYSIS, COMPLETE W/RFL CULTURE
Bilirubin Urine: NEGATIVE
Glucose, UA: NEGATIVE
Hyaline Cast: NONE SEEN /LPF
Ketones, ur: NEGATIVE
Leukocyte Esterase: NEGATIVE
Nitrites, Initial: NEGATIVE
Protein, ur: NEGATIVE
Specific Gravity, Urine: 1.02 (ref 1.001–1.035)
pH: 5.5 (ref 5.0–8.0)

## 2023-10-10 LAB — URINE CULTURE
MICRO NUMBER:: 16364781
Result:: NO GROWTH
SPECIMEN QUALITY:: ADEQUATE

## 2023-10-10 LAB — CULTURE INDICATED

## 2024-01-24 ENCOUNTER — Ambulatory Visit
Admission: RE | Admit: 2024-01-24 | Discharge: 2024-01-24 | Disposition: A | Discharge status: Home / Self Care | Payer: Self-pay | Source: Ambulatory Visit | Attending: Family Medicine | Admitting: Family Medicine

## 2024-01-24 ENCOUNTER — Other Ambulatory Visit: Payer: Self-pay

## 2024-01-24 VITALS — BP 108/71 | HR 75 | Temp 98.1°F | Resp 16

## 2024-01-24 DIAGNOSIS — R300 Dysuria: Secondary | ICD-10-CM | POA: Insufficient documentation

## 2024-01-24 DIAGNOSIS — N898 Other specified noninflammatory disorders of vagina: Secondary | ICD-10-CM | POA: Insufficient documentation

## 2024-01-24 DIAGNOSIS — Z113 Encounter for screening for infections with a predominantly sexual mode of transmission: Secondary | ICD-10-CM | POA: Insufficient documentation

## 2024-01-24 LAB — POCT URINE DIPSTICK
Bilirubin, UA: NEGATIVE
Glucose, UA: NEGATIVE mg/dL
Ketones, POC UA: NEGATIVE mg/dL
Leukocytes, UA: NEGATIVE
Nitrite, UA: NEGATIVE
POC PROTEIN,UA: NEGATIVE
Spec Grav, UA: >=1.03 — AB (ref 1.010–1.025)
Urobilinogen, UA: 0.2 U/dL
pH, UA: 5.5 (ref 5.0–8.0)

## 2024-01-24 LAB — POCT URINE PREGNANCY: Preg Test, Ur: NEGATIVE

## 2024-01-24 MED ORDER — FLUCONAZOLE 150 MG PO TABS: 150.0000 mg | ORAL_TABLET | Freq: Every day | ORAL | 0 refills | Status: AC

## 2024-01-24 NOTE — ED Triage Notes (Signed)
 Pt c/o RLQ, LLQ abdominal pain, dysuria and frequent urination started yesterday. Pt denies N/V/D.

## 2024-01-24 NOTE — ED Provider Notes (Signed)
 [facility]    CSN: [id number] Arrival date & time: 01/24/24  1355      History   Chief Complaint Chief Complaint  Patient presents with   Abdominal Pain   Dysuria   Urinary Frequency    HPI Dana Reed is a 33 y.o. female presents for dysuria and vaginal itching.  Interpretation and uses patient speaks [place].  Patient reports 1 day of urinary burning with frequency as well as vaginal discharge with itching and supra pubic pressure.  Denies any hematuria, urgency, fevers, nausea/vomiting, flank pain, vaginal rashes.  No known STD exposure but would like screening.  Does report a history of recurrent yeast infections.  She denies pregnancy and states she is not breast-feeding.  No OTC treatments have been used for symptoms.  No other concerns at this time   Abdominal Pain Associated symptoms: dysuria and vaginal discharge   Dysuria Associated symptoms: vaginal discharge   Urinary Frequency    Past Medical History:  Diagnosis Date   Cholestasis of pregnancy in third trimester 07/22/2013   Testing at 32 weeks. Delivery at 37 weeks, meds    Headache(784.0)    Migraines   Hx of migraines    duration = 1 year   No pertinent past medical history     Patient Active Problem List   Diagnosis Date Noted   Cholestasis of pregnancy 08/25/2013   Cholestasis of pregnancy in third trimester 07/22/2013   Supervision of high risk pregnancy in third trimester 07/22/2013   History of ovarian cystectomy 07/22/2013   Language barrier 07/15/2013   Maternal varicella, non-immune 07/15/2013   UTI (urinary tract infection) 07/15/2013   Migraines 07/15/2013    Past Surgical History:  Procedure Laterality Date   LAPAROSCOPIC OVARIAN CYSTECTOMY  2007    OB History     Gravida  2   Para  2   Term  2   Preterm  0   AB  0   Living  2      SAB  0   IAB  0   Ectopic  0   Multiple  0   Live Births  2            Home Medications    Prior to  Admission medications   Medication Sig Start Date End Date Taking? Authorizing Provider  fluconazole  (DIFLUCAN ) 150 MG tablet Take 1 tablet (150 mg total) by mouth daily for 2 doses. Take 1 tablet today and you may repeat in 3 days if symptoms persist 01/24/24 01/26/24 Yes Ciara Kagan, Jodi R, NP  pregabalin (LYRICA) 75 MG capsule Take one pill 3x daily 03/20/23   [provider]    Family History Family History  Problem Relation Age of Onset   Diabetes Father    Alcohol abuse Neg Hx    Arthritis Neg Hx    Asthma Neg Hx    Birth defects Neg Hx    Cancer Neg Hx    COPD Neg Hx    Depression Neg Hx    Drug abuse Neg Hx    Early death Neg Hx    Hearing loss Neg Hx    Heart disease Neg Hx    Hyperlipidemia Neg Hx    Hypertension Neg Hx    Kidney disease Neg Hx    Learning disabilities Neg Hx    Mental illness Neg Hx    Mental retardation Neg Hx    Miscarriages / Stillbirths Neg Hx    Stroke Neg  Hx    Vision loss Neg Hx     Social History Social History   Tobacco Use   Smoking status: Never   Smokeless tobacco: Never  Substance Use Topics   Alcohol use: Yes    Comment: occasionally   Drug use: No     Allergies   Latex   Review of Systems Review of Systems  Genitourinary:  Positive for dysuria, frequency and vaginal discharge.     Physical Exam Triage Vital Signs ED Triage Vitals  Encounter Vitals Group     BP 01/24/24 1426 108/71     Girls Systolic BP Percentile --      Girls Diastolic BP Percentile --      Boys Systolic BP Percentile --      Boys Diastolic BP Percentile --      Pulse Rate 01/24/24 1426 75     Resp 01/24/24 1426 16     Temp 01/24/24 1426 98.1 F (36.7 C)     Temp Source 01/24/24 1426 Oral     SpO2 01/24/24 1426 95 %     Weight --      Height --      Head Circumference --      Peak Flow --      Pain Score 01/24/24 1425 8     Pain Loc --      Pain Education --      Exclude from Growth Chart --    No data found.  Updated Vital  Signs BP 108/71   Pulse 75   Temp 98.1 F (36.7 C) (Oral)   Resp 16   LMP 12/31/2023   SpO2 95%   Breastfeeding Yes   Visual Acuity Right Eye Distance:   Left Eye Distance:   Bilateral Distance:    Right Eye Near:   Left Eye Near:    Bilateral Near:     Physical Exam Vitals and nursing note reviewed.  Constitutional:      Appearance: Normal appearance.  HENT:     Head: Normocephalic and atraumatic.  Eyes:     Pupils: Pupils are equal, round, and reactive to light.  Cardiovascular:     Rate and Rhythm: Normal rate.  Pulmonary:     Effort: Pulmonary effort is normal.  Abdominal:     Palpations: Abdomen is soft.     Tenderness: There is no abdominal tenderness. There is no right CVA tenderness or left CVA tenderness.  Skin:    General: Skin is warm and dry.  Neurological:     General: No focal deficit present.     Mental Status: She is alert and oriented to person, place, and time.  Psychiatric:        Mood and Affect: Mood normal.        Behavior: Behavior normal.      UC Treatments / Results  Labs (all labs ordered are listed, but only abnormal results are displayed) Labs Reviewed  POCT URINE DIPSTICK - Abnormal; Notable for the following components:      Result Value   Clarity, UA hazy (*)    Spec Grav, UA >=1.030 (*)    Blood, UA moderate (*)    All other components within normal limits  URINE CULTURE  POCT URINE PREGNANCY  CERVICOVAGINAL ANCILLARY ONLY    EKG   Radiology No results found.  Procedures Procedures (including critical care time)  Medications Ordered in UC Medications - No data to display  Initial Impression / Assessment and Plan /  UC Course  I have reviewed the triage vital signs and the nursing notes.  Pertinent labs & imaging results that were available during my care of the patient were reviewed by me and considered in my medical decision making (see chart for details).     Reviewed exam and symptoms with patient.  No  red flags.  Vaginal swab/STD testing is ordered we will contact for any positive results.  As patient reports symptoms are consistent with previous yeast infections will start Diflucan .  Will send urine culture and contact for any positive results.  Encouraged rest fluids and PCP or GYN follow-up if symptoms do not improve.  ER precautions reviewed. Final Clinical Impressions(s) / UC Diagnoses   Final diagnoses:  Vaginal itching  Screening examination for STD (sexually transmitted disease)  Dysuria     Discharge Instructions      The clinic will contact you with results of the testing done today if positive.  Start Diflucan  as prescribed.  Lots of rest and fluids.  Please follow-up with your PCP or gynecologist if your symptoms do not improve.  Please go to the ER for any worsening symptoms.  Hope you feel better soon!     ED Prescriptions     Medication Sig Dispense Auth. Provider   fluconazole  (DIFLUCAN ) 150 MG tablet Take 1 tablet (150 mg total) by mouth daily for 2 doses. Take 1 tablet today and you may repeat in 3 days if symptoms persist 2 tablet Brynlynn Walko, Jodi R, NP      PDMP not reviewed this encounter.   Loreda Myla SAUNDERS, NP 01/24/24 1451

## 2024-01-24 NOTE — Discharge Instructions (Addendum)
 The clinic will contact you with results of the testing done today if positive.  Start Diflucan  as prescribed.  Lots of rest and fluids.  Please follow-up with your PCP or gynecologist if your symptoms do not improve.  Please go to the ER for any worsening symptoms.  Hope you feel better soon!

## 2024-01-25 LAB — URINE CULTURE: Culture: 20000 — AB

## 2024-01-26 ENCOUNTER — Ambulatory Visit (HOSPITAL_COMMUNITY): Payer: Self-pay

## 2024-01-26 LAB — CERVICOVAGINAL ANCILLARY ONLY
Bacterial Vaginitis (gardnerella): NEGATIVE
Chlamydia: NEGATIVE
Comment: NEGATIVE
Comment: NEGATIVE
Comment: NEGATIVE
Comment: NORMAL
Neisseria Gonorrhea: NEGATIVE
Trichomonas: NEGATIVE

## 2024-01-27 ENCOUNTER — Ambulatory Visit: Payer: Self-pay | Admitting: Nurse Practitioner

## 2024-01-28 ENCOUNTER — Encounter: Payer: Self-pay | Admitting: Nurse Practitioner

## 2024-01-28 ENCOUNTER — Ambulatory Visit (INDEPENDENT_AMBULATORY_CARE_PROVIDER_SITE_OTHER): Payer: Self-pay | Admitting: Nurse Practitioner

## 2024-01-28 VITALS — BP 114/80 | HR 87 | Temp 97.9°F

## 2024-01-28 DIAGNOSIS — R5383 Other fatigue: Secondary | ICD-10-CM

## 2024-01-28 DIAGNOSIS — R350 Frequency of micturition: Secondary | ICD-10-CM

## 2024-01-28 DIAGNOSIS — R102 Pelvic and perineal pain: Secondary | ICD-10-CM

## 2024-01-28 NOTE — Progress Notes (Signed)
   Acute Office Visit  Subjective:    Patient ID: Dana Reed, female    DOB: 1990-06-18, 33 y.o.   MRN: [id number]   HPI 33 y.o. presents today for watery discharge, odor,itching,  pelvic pain, urinary frequency since 8/8. Went to [facility] 8/9 - Neg for yeast, BV, chlamydia, gonorrhea, trich, UA. Was given 2 Diflucan  tablets and feels like it did help with the itching but other symptoms remain. Pelvic pain is in lower abdomen, more in right side. Also complains of fatigue and trouble sleeping. Did increase Lyrica for back pain to 3 times per day at the time sleep disturbance started. Taking Vit D supplement. Interpreter present.   Patient's last menstrual period was 12/31/2023 (exact date). Period Duration (Days): 5 Period Pattern: (!) Irregular Menstrual Control: Tampon, Maxi pad Dysmenorrhea: (!) Severe Dysmenorrhea Symptoms: Cramping  Review of Systems  Constitutional:  Positive for fatigue. Negative for chills and fever.  Genitourinary:  Positive for frequency, pelvic pain and vaginal discharge. Negative for difficulty urinating, dysuria, flank pain, genital sores, hematuria and urgency.       Objective:    Physical Exam Exam conducted with a chaperone present.  Constitutional:      Appearance: Normal appearance.  Abdominal:     Tenderness: There is no right CVA tenderness or left CVA tenderness.  Genitourinary:    General: Normal vulva.     Vagina: Normal.     Cervix: Normal.     Uterus: Enlarged and tender.      Adnexa:        Right: Tenderness present.        Left: Tenderness present.      BP 114/80   Pulse 87   Temp 97.9 F (36.6 C) (Oral)   LMP 12/31/2023 (Exact Date)   SpO2 97%  Wt Readings from Last 3 Encounters:  05/07/23 168 lb (76.2 kg)  09/26/22 164 lb (74.4 kg)  09/09/22 167 lb (75.8 kg)        UA: neg leukocytes, neg nitrite, neg protein, 2+ blood, yellow/cloudy. Microscopic: wbc 0-5, rbc 3-10, moderate bacteria  Assessment & Plan:    Problem List Items Addressed This Visit   None Visit Diagnoses       Pelvic pain    -  Primary   Relevant Orders   US  PELVIS TRANSVAGINAL NON-OB (TV ONLY)     Urinary frequency       Relevant Orders   Urinalysis,Complete w/RFL Culture     Fatigue, unspecified type       Relevant Orders   CBC with Differential/Platelet   Comprehensive metabolic panel with GFR   TSH   Vitamin B12      Plan: UA unremarkable, culture pending. Normal vaginal exam. CBC, CMP, TSH, B12 today for fatigue. Could be s/t poor sleep. Recommend cutting back Lyrica to see if this helps. Planning to get back injection soon, so she will cut back then. Schedule ultrasound.     [name] DNP, 2:32 PM 01/28/2024

## 2024-01-29 ENCOUNTER — Ambulatory Visit: Payer: Self-pay | Admitting: Nurse Practitioner

## 2024-01-29 LAB — COMPREHENSIVE METABOLIC PANEL WITH GFR
AG Ratio: 1.6 (calc) (ref 1.0–2.5)
ALT: 16 U/L (ref 6–29)
AST: 17 U/L (ref 10–30)
Albumin: 4.4 g/dL (ref 3.6–5.1)
Alkaline phosphatase (APISO): 41 U/L (ref 31–125)
BUN: 12 mg/dL (ref 7–25)
CO2: 25 mmol/L (ref 20–32)
Calcium: 9.1 mg/dL (ref 8.6–10.2)
Chloride: 103 mmol/L (ref 98–110)
Creat: 0.57 mg/dL (ref 0.50–0.97)
Globulin: 2.8 g/dL (ref 1.9–3.7)
Glucose, Bld: 88 mg/dL (ref 65–99)
Potassium: 4 mmol/L (ref 3.5–5.3)
Sodium: 137 mmol/L (ref 135–146)
Total Bilirubin: 0.2 mg/dL (ref 0.2–1.2)
Total Protein: 7.2 g/dL (ref 6.1–8.1)
eGFR: 123 mL/min/1.73m2 (ref >=60)

## 2024-01-29 LAB — CBC WITH DIFFERENTIAL/PLATELET
Absolute Lymphocytes: 2619 {cells}/uL (ref 850–3900)
Absolute Monocytes: 757 {cells}/uL (ref 200–950)
Basophils Absolute: 49 {cells}/uL (ref 0–200)
Basophils Relative: 0.5 %
Eosinophils Absolute: 78 {cells}/uL (ref 15–500)
Eosinophils Relative: 0.8 %
HCT: 38 % (ref 35.0–45.0)
Hemoglobin: 12.3 g/dL (ref 11.7–15.5)
MCH: 29.4 pg (ref 27.0–33.0)
MCHC: 32.4 g/dL (ref 32.0–36.0)
MCV: 90.7 fL (ref 80.0–100.0)
MPV: 11.8 fL (ref 7.5–12.5)
Monocytes Relative: 7.8 %
Neutro Abs: 6198 {cells}/uL (ref 1500–7800)
Neutrophils Relative %: 63.9 %
Platelets: 263 Thousand/uL (ref 140–400)
RBC: 4.19 Million/uL (ref 3.80–5.10)
RDW: 13.1 % (ref 11.0–15.0)
Total Lymphocyte: 27 %
WBC: 9.7 Thousand/uL (ref 3.8–10.8)

## 2024-01-29 LAB — TSH: TSH: 0.4 m[IU]/L

## 2024-01-29 LAB — VITAMIN B12: Vitamin B-12: 588 pg/mL (ref 200–1100)

## 2024-01-30 LAB — URINALYSIS, COMPLETE W/RFL CULTURE
Bilirubin Urine: NEGATIVE
Glucose, UA: NEGATIVE
Hyaline Cast: NONE SEEN /LPF
Ketones, ur: NEGATIVE
Leukocyte Esterase: NEGATIVE
Nitrites, Initial: NEGATIVE
Protein, ur: NEGATIVE
Specific Gravity, Urine: 1.02 (ref 1.001–1.035)
pH: 6 (ref 5.0–8.0)

## 2024-01-30 LAB — CULTURE INDICATED

## 2024-01-30 LAB — URINE CULTURE
MICRO NUMBER:: 16825860
Result:: NO GROWTH
SPECIMEN QUALITY:: ADEQUATE

## 2024-02-02 NOTE — Progress Notes (Signed)
 Date of service: 02/02/2024  PREOPERATIVE DIAGNOSIS:  1. Lumbar radiculopathy        POSTOPERATIVE DIAGNOSIS:  1. Lumbar radiculopathy         SURGEON: Rockey JONELLE Pae, MD  PROCEDURE: INTERLAMINAR EPIDURAL STEROID INJECTION WITH FLUOROSCOPY   LEVEL TREATED:   L4-5               THERAPEUTIC AGENT:  2cc sterile saline + 80mg  depomedrol  ANESTHESIA: Local  BLOOD LOSS: minimal  COMPLICATIONS: none  DESCRIPTION: After obtaining informed consent, the patient was positioned on the padded procedure table. The skin overlying the spine was prepped with antiseptic solution and sterily draped in the usual fashion. Local anesthesia was administered in the skin overlying the interspinous space. A sterile, styletted, 17G epidural needle was advanced through the anesthetized skin and into the deeper tissues with fluoroscopic guidance. The stylette was removed. The needle was advanced into the epidural space using lateral fluoroscopy and loss of resistance technique. Once in epidural space, aspiration was negative for heme or air. Contrast material was injected under fluoroscopic observation. Contrast was observed to flow within the epidural space. There was no evidence of contrast spread intravascularly, subarachnoid, or extra-spinally. The therapeutic agent was administered in increments. The needle was flushed and removed intact.  The patient tolerated the procedure well and recovered uneventfully in the pain center recovery room. The procedure was performed as stated above. Discharge instructions were given.   ADDENDUM:    Fluoroscopy time and Exposure, mRad were recorded and scanned into chart for procedure. Image count stored.

## 2024-02-03 ENCOUNTER — Ambulatory Visit (INDEPENDENT_AMBULATORY_CARE_PROVIDER_SITE_OTHER): Payer: Self-pay

## 2024-02-03 DIAGNOSIS — R102 Pelvic and perineal pain: Secondary | ICD-10-CM

## 2024-02-04 ENCOUNTER — Other Ambulatory Visit: Payer: Self-pay | Admitting: Nurse Practitioner

## 2024-02-04 DIAGNOSIS — N83299 Other ovarian cyst, unspecified side: Secondary | ICD-10-CM

## 2024-02-04 DIAGNOSIS — R102 Pelvic and perineal pain: Secondary | ICD-10-CM

## 2024-02-06 ENCOUNTER — Ambulatory Visit (INDEPENDENT_AMBULATORY_CARE_PROVIDER_SITE_OTHER): Payer: Self-pay | Admitting: Nurse Practitioner

## 2024-02-06 ENCOUNTER — Encounter: Payer: Self-pay | Admitting: Nurse Practitioner

## 2024-02-06 VITALS — BP 118/76 | HR 71 | Ht 63.0 in | Wt 165.0 lb

## 2024-02-06 DIAGNOSIS — N760 Acute vaginitis: Secondary | ICD-10-CM

## 2024-02-06 LAB — WET PREP FOR TRICH, YEAST, CLUE

## 2024-02-06 MED ORDER — FLUCONAZOLE 150 MG PO TABS: 150.0000 mg | ORAL_TABLET | ORAL | 0 refills | Status: DC

## 2024-02-06 NOTE — Progress Notes (Signed)
   Acute Office Visit  Subjective:    Patient ID: Dana Reed, female    DOB: May 27, 1991, 33 y.o.   MRN: [id number]   HPI 33 y.o. presents today for external itching and discharge x 2 days. Symptoms started day after vaginal ultrasound. Interpreter present.   Patient's last menstrual period was 12/31/2023 (exact date). Period Duration (Days): 6 Period Pattern: (!) Irregular Menstrual Flow: Heavy, Moderate Menstrual Control: Maxi pad Menstrual Control Change Freq (Hours): 2 Dysmenorrhea: (!) Severe Dysmenorrhea Symptoms: Cramping, Diarrhea  Review of Systems  Constitutional: Negative.   Genitourinary:  Positive for vaginal discharge and vaginal pain (External itching).       Objective:    Physical Exam Exam conducted with a chaperone present.  Constitutional:      Appearance: Normal appearance.  Genitourinary:    General: Normal vulva.     Vagina: Vaginal discharge present. No erythema.     Cervix: Normal.     BP 118/76 (BP Location: Right Arm, Patient Position: Sitting, Cuff Size: Normal)   Pulse 71   Ht 5' 3 (1.6 m)   Wt 165 lb (74.8 kg)   LMP 12/31/2023 (Exact Date)   SpO2 99%   BMI 29.23 kg/m  Wt Readings from Last 3 Encounters:  02/06/24 165 lb (74.8 kg)  05/07/23 168 lb (76.2 kg)  09/26/22 164 lb (74.4 kg)        Wet prep negative for pathogens  Assessment & Plan:   Problem List Items Addressed This Visit   None Visit Diagnoses       Acute vaginitis    -  Primary   Relevant Medications   fluconazole  (DIFLUCAN ) 150 MG tablet   Other Relevant Orders   WET PREP FOR TRICH, YEAST, CLUE      Plan: Negative wet prep. Will treat for possible yeast. Diflucan  150 mg every 3 days x 2 doses.   Return if symptoms worsen or fail to improve.    [name] DNP, 11:26 AM 02/06/2024

## 2024-02-20 ENCOUNTER — Ambulatory Visit: Payer: Self-pay | Admitting: Nurse Practitioner

## 2024-03-25 ENCOUNTER — Ambulatory Visit (INDEPENDENT_AMBULATORY_CARE_PROVIDER_SITE_OTHER): Payer: Self-pay

## 2024-03-25 ENCOUNTER — Encounter: Payer: Self-pay | Admitting: Nurse Practitioner

## 2024-03-25 ENCOUNTER — Ambulatory Visit (INDEPENDENT_AMBULATORY_CARE_PROVIDER_SITE_OTHER): Payer: Self-pay | Admitting: Nurse Practitioner

## 2024-03-25 VITALS — BP 106/70 | HR 82

## 2024-03-25 DIAGNOSIS — N83299 Other ovarian cyst, unspecified side: Secondary | ICD-10-CM

## 2024-03-25 DIAGNOSIS — R102 Pelvic and perineal pain unspecified side: Secondary | ICD-10-CM

## 2024-03-25 NOTE — Progress Notes (Signed)
   Acute Office Visit  Subjective:    Patient ID: Dana Reed, female    DOB: [birth year], 33 y.o.   MRN: [id number]   HPI 33 y.o. presents today for ultrasound. Seen 01/28/24 with complaints of pelvic pain R>L. US  02/03/24 showed right ovary complex hemorrhagia cyst 28 x 22.  Reports pain has resolved. Last menstrual period was less painful as well. Interpreter present.   Patient's last menstrual period was 03/17/2024 (exact date). Period Duration (Days): 6 Period Pattern: (!) Irregular Menstrual Flow: Heavy (heavy for 2 days) Menstrual Control: Maxi pad Dysmenorrhea: (!) Severe Dysmenorrhea Symptoms: Cramping  Review of Systems  Constitutional: Negative.   Genitourinary: Negative.        Objective:    Physical Exam Constitutional:      Appearance: Normal appearance.   GU: Not indicated  BP 106/70   Pulse 82   LMP 03/17/2024 (Exact Date)   SpO2 99%  Wt Readings from Last 3 Encounters:  02/06/24 165 lb (74.8 kg)  05/07/23 168 lb (76.2 kg)  09/26/22 164 lb (74.4 kg)        Assessment & Plan:   Problem List Items Addressed This Visit   None Visit Diagnoses       Complex ovarian cyst    -  Primary      Vaginal ultrasound (comparison is made to a 02/03/24 ultrasound): Anteverted uterus, normal size and shape, fibroid as seen on previous scan.  Thin, symmetrical endometrium.  No masses or thickening seen.  Both ovaries normal size with normal follicle pattern and normal perfusion.  Previous complex cyst has resolved.  No adnexal masses, no free fluid.  Plan: Ultrasound reviewed. Complex cyst has resolved. Pain has resolved. Will monitor.   Return if symptoms worsen or fail to improve.    Dana DELENA Shutter DNP, 11:01 AM 03/25/2024

## 2024-04-09 ENCOUNTER — Encounter: Payer: Self-pay | Admitting: Nurse Practitioner

## 2024-04-09 ENCOUNTER — Ambulatory Visit (INDEPENDENT_AMBULATORY_CARE_PROVIDER_SITE_OTHER): Payer: Self-pay | Admitting: Nurse Practitioner

## 2024-04-09 VITALS — BP 108/70 | HR 88 | Ht 63.0 in | Wt 168.0 lb

## 2024-04-09 DIAGNOSIS — B3731 Acute candidiasis of vulva and vagina: Secondary | ICD-10-CM

## 2024-04-09 DIAGNOSIS — L292 Pruritus vulvae: Secondary | ICD-10-CM

## 2024-04-09 LAB — WET PREP FOR TRICH, YEAST, CLUE

## 2024-04-09 MED ORDER — FLUCONAZOLE 150 MG PO TABS: 150.0000 mg | ORAL_TABLET | ORAL | 0 refills | Status: DC

## 2024-04-09 NOTE — Progress Notes (Signed)
   Acute Office Visit  Subjective:    Patient ID: Dana Reed, female    DOB: [birth year], 33 y.o.   MRN: [id number]   HPI 33 y.o. presents today for external itching, white discharge and pain since yesterday. Feels this is recurrent. Last + wet prep in March 2025. Negative wet prep in August. Interpreter present.   Patient's last menstrual period was 03/14/2024 (approximate). Period Duration (Days): 6 Period Pattern: (!) Irregular Menstrual Flow: Heavy Menstrual Control: Maxi pad Menstrual Control Change Freq (Hours): 2 Dysmenorrhea: (!) Moderate Dysmenorrhea Symptoms: Cramping  Review of Systems  Constitutional: Negative.   Genitourinary:  Positive for vaginal discharge and vaginal pain (Vulvar itching).       Objective:    Physical Exam Constitutional:      Appearance: Normal appearance.  Genitourinary:    Vagina: Vaginal discharge present. No erythema.     Cervix: Normal.     Comments: Mild external redness    BP 108/70 (BP Location: Right Arm, Patient Position: Sitting, Cuff Size: Normal)   Pulse 88   Ht 5' 3 (1.6 m)   Wt 168 lb (76.2 kg)   LMP 03/14/2024 (Approximate)   SpO2 99%   BMI 29.76 kg/m  Wt Readings from Last 3 Encounters:  04/09/24 168 lb (76.2 kg)  02/06/24 165 lb (74.8 kg)  05/07/23 168 lb (76.2 kg)        Dana Reed, CMA present as Biomedical engineer.   Wet prep + yeast  Assessment & Plan:   Problem List Items Addressed This Visit   None Visit Diagnoses       Vaginal candidiasis    -  Primary   Relevant Medications   fluconazole  (DIFLUCAN ) 150 MG tablet     Vulvar itching       Relevant Orders   WET PREP FOR TRICH, YEAST, CLUE (Completed)      Plan: Wet prep positive for yeast - Diflucan  150 mg every 3 days x 2 doses. Recommend starting women's probiotic, avoid harsh soaps and detergents.   Return if symptoms worsen or fail to improve.    Dana DELENA Shutter DNP, 2:20 PM 04/09/2024

## 2024-04-23 ENCOUNTER — Ambulatory Visit (INDEPENDENT_AMBULATORY_CARE_PROVIDER_SITE_OTHER): Payer: Self-pay | Admitting: Nurse Practitioner

## 2024-04-23 VITALS — BP 100/72 | HR 120 | Wt 167.0 lb

## 2024-04-23 DIAGNOSIS — B9689 Other specified bacterial agents as the cause of diseases classified elsewhere: Secondary | ICD-10-CM

## 2024-04-23 DIAGNOSIS — N760 Acute vaginitis: Secondary | ICD-10-CM

## 2024-04-23 DIAGNOSIS — N898 Other specified noninflammatory disorders of vagina: Secondary | ICD-10-CM

## 2024-04-23 LAB — WET PREP FOR TRICH, YEAST, CLUE

## 2024-04-23 MED ORDER — METRONIDAZOLE 500 MG PO TABS: 500.0000 mg | ORAL_TABLET | Freq: Two times a day (BID) | ORAL | 0 refills | Status: AC

## 2024-04-23 MED ORDER — FLUCONAZOLE 150 MG PO TABS: 150.0000 mg | ORAL_TABLET | ORAL | 0 refills | Status: AC

## 2024-04-23 NOTE — Progress Notes (Signed)
   Acute Office Visit  Subjective:    Patient ID: Dana Reed, female    DOB: [birth year], 33 y.o.   MRN: [id number]   HPI 33 y.o. presents today for vaginal itching and thick discharge x 1 day. No odor. Symptoms began after her menses.Treated for yeast 04/09/24, symptoms fully resolved. Feels she gets frequent yeast infections. Uses dove sensitive soap. Interpreter present.   Patient's last menstrual period was 04/13/2024 (exact date).    Review of Systems  Constitutional: Negative.   Genitourinary:  Positive for vaginal discharge and vaginal pain (Itching).       Objective:    Physical Exam Constitutional:      Appearance: Normal appearance.  Genitourinary:    General: Normal vulva.     Vagina: Vaginal discharge present. No erythema.     Cervix: Normal.     Comments: + odor    BP 100/72 (BP Location: Right Arm, Patient Position: Sitting, Cuff Size: Normal)   Pulse (!) 120   Wt 167 lb (75.8 kg)   LMP 04/13/2024 (Exact Date)   SpO2 98%   BMI 29.58 kg/m  Wt Readings from Last 3 Encounters:  04/23/24 167 lb (75.8 kg)  04/09/24 168 lb (76.2 kg)  02/06/24 165 lb (74.8 kg)        Clotilda Pa, CMA present as biomedical engineer.   Wet prep + clue cells (+ odor)  Assessment & Plan:   Problem List Items Addressed This Visit   None Visit Diagnoses       Bacterial vaginosis    -  Primary   Relevant Medications   metroNIDAZOLE (FLAGYL) 500 MG tablet        Vaginal itching       Relevant Orders   WET PREP FOR TRICH, YEAST, CLUE     Recurrent vaginitis       Relevant Medications   fluconazole  (DIFLUCAN ) 150 MG tablet      Plan: BV - Flagyl 500 mg BID x 7 days. Diflucan  provided.   Return if symptoms worsen or fail to improve.    [name] DNP, 12:35 PM 04/23/2024

## 2024-04-26 ENCOUNTER — Encounter: Payer: Self-pay | Admitting: Nurse Practitioner

## 2024-05-11 ENCOUNTER — Encounter: Payer: Self-pay | Admitting: Nurse Practitioner

## 2024-05-11 ENCOUNTER — Ambulatory Visit (INDEPENDENT_AMBULATORY_CARE_PROVIDER_SITE_OTHER): Payer: Self-pay | Admitting: Nurse Practitioner

## 2024-05-11 VITALS — BP 118/82 | HR 83 | Ht 62.75 in | Wt 166.0 lb

## 2024-05-11 DIAGNOSIS — Z01419 Encounter for gynecological examination (general) (routine) without abnormal findings: Secondary | ICD-10-CM

## 2024-05-11 DIAGNOSIS — N898 Other specified noninflammatory disorders of vagina: Secondary | ICD-10-CM

## 2024-05-11 DIAGNOSIS — A609 Anogenital herpesviral infection, unspecified: Secondary | ICD-10-CM

## 2024-05-11 DIAGNOSIS — Z1331 Encounter for screening for depression: Secondary | ICD-10-CM

## 2024-05-11 LAB — WET PREP FOR TRICH, YEAST, CLUE

## 2024-05-11 NOTE — Progress Notes (Signed)
 Dana Reed [birth year] [id number]   History:  33 y.o. G2P2002 presents for annual exam. Monthly cycles. Normal pap history. 2007 ovarian cystectomy. HSV, valtrex  as needed. Treated for BV and yeast 11/7. Symptoms improved but has noticed more discharge the last couple of days and would like to recheck today. Interpreter present during visit.   Gynecologic History Patient's last menstrual period was 04/13/2024 (exact date). Period Cycle (Days): 28 (regular x's 2 months) Period Duration (Days): 6 Period Pattern: Regular Menstrual Flow: Heavy (heavy first 2 days) Menstrual Control: Thin pad, Maxi pad Dysmenorrhea: None Contraception/Family planning: none Sexually active: Yes  Health Maintenance Last Pap: 03/14/2021. Results were: Normal neg HPV Last mammogram: Not indicated  Last colonoscopy: Not indicated Last Dexa: Not indicated     05/11/2024   10:11 AM  Depression screen PHQ 2/9  Decreased Interest 0  Down, Depressed, Hopeless 0  PHQ - 2 Score 0     Past medical history, past surgical history, family history and social history were all reviewed and documented in the EPIC chart. 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  ROS:  A ROS was performed and pertinent positives and negatives are included.  Exam:  Vitals:   05/11/24 1008  BP: 118/82  Pulse: 83  SpO2: 99%  Weight: 166 lb (75.3 kg)  Height: 5' 2.75 (1.594 m)      Body mass index is 29.64 kg/m.  General appearance:  Normal Thyroid:  Symmetrical, normal in size, without palpable masses or nodularity. Respiratory  Auscultation:  Clear without wheezing or rhonchi Cardiovascular  Auscultation:  Regular rate, without rubs, murmurs or gallops  Edema/varicosities:  Not grossly evident Abdominal  Soft,nontender, without masses, guarding or rebound.  Liver/spleen:  No organomegaly noted  Hernia:  None appreciated  Skin  Inspection:  Grossly normal Breasts: Examined lying and  sitting.   Right: Without masses, retractions, nipple discharge or axillary adenopathy.   Left: Without masses, retractions, nipple discharge or axillary adenopathy. Pelvic: External genitalia:  no lesions, some redness              Urethra:  normal appearing urethra with no masses, tenderness or lesions              Bartholins and Skenes: normal                 Vagina: White, chunky discharge, mild erythema              Cervix: no lesions Bimanual Exam:  Uterus:  no masses or tenderness              Adnexa: no mass, fullness, tenderness              Rectovaginal: Deferred              Anus:  normal, no lesions  Darice Hoit, CMA present as chaperone.  Wet prep negative for pathogens  Assessment/Plan:  33 y.o. [id number] for annual exam.   Well female exam with routine gynecological exam - Education provided on SBEs, importance of preventative screenings, current guidelines, high calcium diet, regular exercise, and multivitamin daily. Labs in August.   HSV (herpes simplex virus) anogenital infection - Rare outbreaks. Valtrex  as needed.   Depression screening - PHQ - 0  Vaginal discharge - Plan: WET PREP FOR TRICH, YEAST, CLUE. Negative wet prep.   Screening for cervical cancer - Normal Pap history.  Will repeat at 5-year interval per guidelines.   Return in about 1  year (around 05/11/2025) for Annual.    [name] DNP, 10:50 AM 05/11/2024

## 2024-07-09 ENCOUNTER — Encounter: Payer: Self-pay | Admitting: Radiology

## 2024-07-09 ENCOUNTER — Ambulatory Visit (INDEPENDENT_AMBULATORY_CARE_PROVIDER_SITE_OTHER): Payer: Self-pay | Admitting: Radiology

## 2024-07-09 VITALS — BP 102/70 | HR 74 | Temp 98.2°F | Wt 165.0 lb

## 2024-07-09 DIAGNOSIS — N898 Other specified noninflammatory disorders of vagina: Secondary | ICD-10-CM

## 2024-07-09 DIAGNOSIS — R102 Pelvic and perineal pain unspecified side: Secondary | ICD-10-CM

## 2024-07-09 LAB — URINALYSIS, COMPLETE W/RFL CULTURE
Bacteria, UA: NONE SEEN /HPF
Bilirubin Urine: NEGATIVE
Glucose, UA: NEGATIVE
Hyaline Cast: NONE SEEN /LPF
Ketones, ur: NEGATIVE
Leukocyte Esterase: NEGATIVE
Nitrites, Initial: NEGATIVE
Protein, ur: NEGATIVE
RBC / HPF: NONE SEEN /HPF (ref 0–2)
Specific Gravity, Urine: 1.004 (ref 1.001–1.035)
WBC, UA: NONE SEEN /HPF (ref 0–5)
pH: 6 (ref 5.0–8.0)

## 2024-07-09 LAB — WET PREP FOR TRICH, YEAST, CLUE

## 2024-07-09 LAB — NO CULTURE INDICATED

## 2024-07-09 NOTE — Progress Notes (Signed)
" ° ° ° ° °  Subjective: Dana Reed is a Spanish speaking (video interpreter used) 34 y.o. female who complains of  pelvic pain, watery vaginal discharge, itching x's 2 days. No new soaps or sexual partners.     Review of Systems  All other systems reviewed and are negative.   Past Medical History:  Diagnosis Date   Cholestasis of pregnancy in third trimester 07/22/2013   Testing at 32 weeks. Delivery at 37 weeks, meds    Headache(784.0)    Migraines   Hx of migraines    duration = 1 year   No pertinent past medical history       Objective:  Today's Vitals   07/09/24 1204  BP: 102/70  Pulse: 74  Temp: 98.2 F (36.8 C)  TempSrc: Oral  SpO2: 99%  Weight: 165 lb (74.8 kg)   Body mass index is 29.46 kg/m.   Physical Exam Vitals and nursing note reviewed. Exam conducted with a chaperone present.  Constitutional:      Appearance: Normal appearance. She is well-developed.  Pulmonary:     Effort: Pulmonary effort is normal.  Abdominal:     General: Abdomen is flat.     Palpations: Abdomen is soft.  Genitourinary:    General: Normal vulva.     Vagina: Vaginal discharge present. No erythema, bleeding or lesions.     Cervix: Normal. No discharge, friability, lesion or erythema.     Uterus: Normal.      Adnexa: Right adnexa normal and left adnexa normal.  Neurological:     Mental Status: She is alert.  Psychiatric:        Mood and Affect: Mood normal.        Thought Content: Thought content normal.        Judgment: Judgment normal.     Urine dipstick shows positive for RBC's.  Micro exam: negative for WBC's or RBC's.  Microscopic wet-mount exam shows negative for pathogens, normal epithelial cells.   Darice Hoit, CMA present for exam  Assessment:/Plan:   1. Vaginal discharge (Primary) Negative- pt reassured - WET PREP FOR TRICH, YEAST, CLUE  2. Pelvic pain Negative UA - Urinalysis,Complete w/RFL Culture  Avoid intercourse until symptoms are resolved.  Safe sex encouraged. Avoid the use of soaps or perfumed products in the peri area. Avoid tub baths and sitting in sweaty or wet clothing for prolonged periods of time.    Arthor Gorter B, NP 12:16 PM  "
